# Patient Record
Sex: Female | Born: 1980 | Race: White | Hispanic: No | Marital: Single | Smoking: Current every day smoker
Health system: Southern US, Community
[De-identification: ages and names within clinical notes are randomized; demographics above are authoritative.]

## PROBLEM LIST (undated history)

## (undated) DIAGNOSIS — F1910 Other psychoactive substance abuse, uncomplicated: Secondary | ICD-10-CM

## (undated) HISTORY — PX: ELBOW FRACTURE SURGERY: SHX616

## (undated) HISTORY — PX: HERNIA REPAIR: SHX51

---
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
## 2008-03-10 ENCOUNTER — Emergency Department (HOSPITAL_COMMUNITY): Admission: EM | Admit: 2008-03-10 | Discharge: 2008-03-10 | Payer: Self-pay | Admitting: Family Medicine

## 2009-05-31 ENCOUNTER — Emergency Department (HOSPITAL_COMMUNITY): Admission: EM | Admit: 2009-05-31 | Discharge: 2009-05-31 | Payer: Self-pay | Admitting: Emergency Medicine

## 2009-06-01 ENCOUNTER — Emergency Department (HOSPITAL_COMMUNITY): Admission: EM | Admit: 2009-06-01 | Discharge: 2009-06-01 | Payer: Self-pay | Admitting: Emergency Medicine

## 2010-04-11 ENCOUNTER — Emergency Department (HOSPITAL_COMMUNITY): Admission: EM | Admit: 2010-04-11 | Discharge: 2010-04-11 | Payer: Self-pay | Admitting: Family Medicine

## 2010-04-25 ENCOUNTER — Inpatient Hospital Stay (HOSPITAL_COMMUNITY): Admission: AD | Admit: 2010-04-25 | Discharge: 2010-04-25 | Payer: Self-pay | Admitting: Obstetrics and Gynecology

## 2010-04-25 ENCOUNTER — Ambulatory Visit: Payer: Self-pay | Admitting: Family

## 2010-05-04 ENCOUNTER — Encounter (INDEPENDENT_AMBULATORY_CARE_PROVIDER_SITE_OTHER): Payer: Self-pay | Admitting: Family Medicine

## 2010-05-04 ENCOUNTER — Ambulatory Visit: Payer: Self-pay | Admitting: Obstetrics and Gynecology

## 2010-05-04 LAB — CONVERTED CEMR LAB
Pap Smear: UNDETERMINED
Trich, Wet Prep: NONE SEEN
Yeast Wet Prep HPF POC: NONE SEEN

## 2010-05-09 ENCOUNTER — Ambulatory Visit (HOSPITAL_COMMUNITY): Admission: RE | Admit: 2010-05-09 | Discharge: 2010-05-09 | Payer: Self-pay | Admitting: Family Medicine

## 2010-06-15 ENCOUNTER — Ambulatory Visit: Payer: Self-pay | Admitting: Obstetrics and Gynecology

## 2010-06-29 ENCOUNTER — Ambulatory Visit: Payer: Self-pay | Admitting: Obstetrics & Gynecology

## 2010-07-13 ENCOUNTER — Ambulatory Visit: Payer: Self-pay | Admitting: Obstetrics and Gynecology

## 2010-07-14 ENCOUNTER — Ambulatory Visit (HOSPITAL_COMMUNITY): Admission: RE | Admit: 2010-07-14 | Discharge: 2010-07-14 | Payer: Self-pay | Admitting: Family Medicine

## 2010-08-10 ENCOUNTER — Ambulatory Visit: Payer: Self-pay | Admitting: Obstetrics and Gynecology

## 2010-08-10 LAB — CONVERTED CEMR LAB
Antibody Screen: POSITIVE — AB
Basophils Absolute: 0 10*3/uL (ref 0.0–0.1)
Basophils Relative: 0 % (ref 0–1)
Eosinophils Absolute: 0.1 10*3/uL (ref 0.0–0.7)
Eosinophils Relative: 0 % (ref 0–5)
HCT: 31.8 % — ABNORMAL LOW (ref 36.0–46.0)
HIV: NONREACTIVE
Hemoglobin: 10.7 g/dL — ABNORMAL LOW (ref 12.0–15.0)
Hepatitis B Surface Ag: NEGATIVE
Lymphocytes Relative: 15 % (ref 12–46)
Lymphs Abs: 1.9 10*3/uL (ref 0.7–4.0)
MCHC: 33.6 g/dL (ref 30.0–36.0)
MCV: 91.4 fL (ref 78.0–100.0)
Monocytes Absolute: 0.8 10*3/uL (ref 0.1–1.0)
Monocytes Relative: 6 % (ref 3–12)
Neutro Abs: 10 10*3/uL — ABNORMAL HIGH (ref 1.7–7.7)
Neutrophils Relative %: 78 % — ABNORMAL HIGH (ref 43–77)
Platelets: 399 10*3/uL (ref 150–400)
RBC: 3.48 M/uL — ABNORMAL LOW (ref 3.87–5.11)
RDW: 13.9 % (ref 11.5–15.5)
Rh Type: NEGATIVE
Rubella: 21.2 intl units/mL — ABNORMAL HIGH
WBC: 12.8 10*3/uL — ABNORMAL HIGH (ref 4.0–10.5)

## 2010-09-01 ENCOUNTER — Ambulatory Visit: Payer: Self-pay | Admitting: Obstetrics and Gynecology

## 2010-09-04 ENCOUNTER — Inpatient Hospital Stay (HOSPITAL_COMMUNITY)
Admission: AD | Admit: 2010-09-04 | Discharge: 2010-09-04 | Payer: Self-pay | Source: Home / Self Care | Admitting: Obstetrics & Gynecology

## 2010-09-15 ENCOUNTER — Ambulatory Visit: Payer: Self-pay | Admitting: Family Medicine

## 2010-09-15 ENCOUNTER — Encounter: Payer: Self-pay | Admitting: Physician Assistant

## 2010-09-15 LAB — CONVERTED CEMR LAB
Antibody Screen: POSITIVE — AB
Rh Type: NEGATIVE

## 2010-09-29 ENCOUNTER — Ambulatory Visit: Payer: Self-pay | Admitting: Obstetrics & Gynecology

## 2010-09-29 ENCOUNTER — Encounter (INDEPENDENT_AMBULATORY_CARE_PROVIDER_SITE_OTHER): Payer: Self-pay | Admitting: *Deleted

## 2010-09-30 ENCOUNTER — Ambulatory Visit (HOSPITAL_COMMUNITY)
Admission: RE | Admit: 2010-09-30 | Discharge: 2010-09-30 | Payer: Self-pay | Source: Home / Self Care | Attending: Obstetrics and Gynecology | Admitting: Obstetrics and Gynecology

## 2010-10-13 ENCOUNTER — Ambulatory Visit: Payer: Self-pay | Admitting: Obstetrics & Gynecology

## 2010-10-13 ENCOUNTER — Encounter: Payer: Self-pay | Admitting: Obstetrics & Gynecology

## 2010-10-13 LAB — CONVERTED CEMR LAB
Amphetamine Screen, Ur: NEGATIVE
Barbiturate Quant, Ur: NEGATIVE
Benzodiazepines.: NEGATIVE
Cocaine Metabolites: NEGATIVE
Creatinine,U: 94.1 mg/dL
Marijuana Metabolite: NEGATIVE
Methadone: NEGATIVE
Opiate Screen, Urine: NEGATIVE
Phencyclidine (PCP): NEGATIVE
Propoxyphene: NEGATIVE
Rh Type: NEGATIVE

## 2010-10-14 ENCOUNTER — Encounter: Payer: Self-pay | Admitting: Obstetrics & Gynecology

## 2010-10-14 ENCOUNTER — Ambulatory Visit (HOSPITAL_COMMUNITY)
Admission: RE | Admit: 2010-10-14 | Discharge: 2010-10-14 | Payer: Self-pay | Source: Home / Self Care | Attending: Obstetrics and Gynecology | Admitting: Obstetrics and Gynecology

## 2010-10-14 LAB — CONVERTED CEMR LAB
Chlamydia, DNA Probe: NEGATIVE
GC Probe Amp, Genital: NEGATIVE

## 2010-10-17 ENCOUNTER — Encounter: Payer: Self-pay | Admitting: Obstetrics & Gynecology

## 2010-10-17 ENCOUNTER — Ambulatory Visit
Admission: RE | Admit: 2010-10-17 | Discharge: 2010-10-17 | Payer: Self-pay | Source: Home / Self Care | Attending: Obstetrics & Gynecology | Admitting: Obstetrics & Gynecology

## 2010-10-17 LAB — CONVERTED CEMR LAB: Antibody Screen: POSITIVE — AB

## 2010-10-20 ENCOUNTER — Ambulatory Visit: Admit: 2010-10-20 | Payer: Self-pay | Admitting: Obstetrics and Gynecology

## 2010-10-20 ENCOUNTER — Inpatient Hospital Stay (HOSPITAL_COMMUNITY)
Admission: RE | Admit: 2010-10-20 | Discharge: 2010-10-23 | Payer: Self-pay | Source: Home / Self Care | Attending: Family Medicine | Admitting: Family Medicine

## 2010-10-20 LAB — CBC
HCT: 33.5 % — ABNORMAL LOW (ref 36.0–46.0)
Hemoglobin: 11.1 g/dL — ABNORMAL LOW (ref 12.0–15.0)
MCH: 29.3 pg (ref 26.0–34.0)
MCHC: 33.1 g/dL (ref 30.0–36.0)
MCV: 88.4 fL (ref 78.0–100.0)
Platelets: 307 10*3/uL (ref 150–400)
RBC: 3.79 MIL/uL — ABNORMAL LOW (ref 3.87–5.11)
RDW: 14.2 % (ref 11.5–15.5)
WBC: 11.7 10*3/uL — ABNORMAL HIGH (ref 4.0–10.5)

## 2010-10-20 LAB — MRSA PCR SCREENING: MRSA by PCR: NEGATIVE

## 2010-10-20 LAB — RAPID URINE DRUG SCREEN, HOSP PERFORMED
Amphetamines: NOT DETECTED
Barbiturates: NOT DETECTED
Benzodiazepines: NOT DETECTED
Cocaine: NOT DETECTED
Opiates: NOT DETECTED
Tetrahydrocannabinol: NOT DETECTED

## 2010-10-20 LAB — RPR: RPR Ser Ql: NONREACTIVE

## 2010-10-21 ENCOUNTER — Encounter: Payer: Self-pay | Admitting: Family Medicine

## 2010-10-31 LAB — CBC
HCT: 25.3 % — ABNORMAL LOW (ref 36.0–46.0)
Hemoglobin: 8.2 g/dL — ABNORMAL LOW (ref 12.0–15.0)
MCH: 29.2 pg (ref 26.0–34.0)
MCHC: 32.4 g/dL (ref 30.0–36.0)
MCV: 90 fL (ref 78.0–100.0)
Platelets: 237 10*3/uL (ref 150–400)
RBC: 2.81 MIL/uL — ABNORMAL LOW (ref 3.87–5.11)
RDW: 14.3 % (ref 11.5–15.5)
WBC: 15.7 10*3/uL — ABNORMAL HIGH (ref 4.0–10.5)

## 2010-11-23 ENCOUNTER — Ambulatory Visit (INDEPENDENT_AMBULATORY_CARE_PROVIDER_SITE_OTHER): Payer: Self-pay | Admitting: Family Medicine

## 2010-11-23 DIAGNOSIS — A630 Anogenital (venereal) warts: Secondary | ICD-10-CM

## 2010-11-23 DIAGNOSIS — R87611 Atypical squamous cells cannot exclude high grade squamous intraepithelial lesion on cytologic smear of cervix (ASC-H): Secondary | ICD-10-CM

## 2010-11-25 ENCOUNTER — Encounter: Payer: Self-pay | Admitting: Obstetrics & Gynecology

## 2010-11-25 ENCOUNTER — Other Ambulatory Visit: Payer: Self-pay | Admitting: Obstetrics & Gynecology

## 2010-11-25 ENCOUNTER — Other Ambulatory Visit (HOSPITAL_COMMUNITY)
Admission: RE | Admit: 2010-11-25 | Discharge: 2010-11-25 | Disposition: A | Discharge status: Home / Self Care | Payer: Medicaid Other | Source: Ambulatory Visit | Attending: Family Medicine | Admitting: Family Medicine

## 2010-11-25 DIAGNOSIS — R87611 Atypical squamous cells cannot exclude high grade squamous intraepithelial lesion on cytologic smear of cervix (ASC-H): Secondary | ICD-10-CM

## 2010-11-25 DIAGNOSIS — R87619 Unspecified abnormal cytological findings in specimens from cervix uteri: Secondary | ICD-10-CM | POA: Insufficient documentation

## 2010-12-14 ENCOUNTER — Ambulatory Visit: Payer: Self-pay | Admitting: Family Medicine

## 2010-12-26 LAB — POCT URINALYSIS DIPSTICK
Bilirubin Urine: NEGATIVE
Bilirubin Urine: NEGATIVE
Glucose, UA: NEGATIVE mg/dL
Glucose, UA: NEGATIVE mg/dL
Hgb urine dipstick: NEGATIVE
Ketones, ur: NEGATIVE mg/dL
Ketones, ur: NEGATIVE mg/dL
Nitrite: NEGATIVE
Nitrite: NEGATIVE
Protein, ur: NEGATIVE mg/dL
Protein, ur: NEGATIVE mg/dL
Specific Gravity, Urine: 1.015 (ref 1.005–1.030)
Specific Gravity, Urine: 1.02 (ref 1.005–1.030)
Urobilinogen, UA: 0.2 mg/dL (ref 0.0–1.0)
Urobilinogen, UA: 0.2 mg/dL (ref 0.0–1.0)
pH: 7 (ref 5.0–8.0)
pH: 8 (ref 5.0–8.0)

## 2010-12-27 LAB — POCT URINALYSIS DIPSTICK
Bilirubin Urine: NEGATIVE
Glucose, UA: NEGATIVE mg/dL
Hgb urine dipstick: NEGATIVE
Ketones, ur: NEGATIVE mg/dL
Nitrite: NEGATIVE
Protein, ur: NEGATIVE mg/dL
Specific Gravity, Urine: 1.02 (ref 1.005–1.030)
Urobilinogen, UA: 0.2 mg/dL (ref 0.0–1.0)
pH: 7 (ref 5.0–8.0)

## 2010-12-27 LAB — URINALYSIS, ROUTINE W REFLEX MICROSCOPIC
Bilirubin Urine: NEGATIVE
Glucose, UA: NEGATIVE mg/dL
Hgb urine dipstick: NEGATIVE
Ketones, ur: NEGATIVE mg/dL
Nitrite: NEGATIVE
Protein, ur: NEGATIVE mg/dL
Specific Gravity, Urine: 1.01 (ref 1.005–1.030)
Urobilinogen, UA: 0.2 mg/dL (ref 0.0–1.0)
pH: 6.5 (ref 5.0–8.0)

## 2010-12-28 LAB — POCT URINALYSIS DIPSTICK
Bilirubin Urine: NEGATIVE
Glucose, UA: NEGATIVE mg/dL
Hgb urine dipstick: NEGATIVE
Ketones, ur: NEGATIVE mg/dL
Nitrite: NEGATIVE
Protein, ur: NEGATIVE mg/dL
Specific Gravity, Urine: 1.015 (ref 1.005–1.030)
Urobilinogen, UA: 0.2 mg/dL (ref 0.0–1.0)
pH: 7 (ref 5.0–8.0)

## 2011-01-01 LAB — POCT URINALYSIS DIP (DEVICE)
Bilirubin Urine: NEGATIVE
Glucose, UA: NEGATIVE mg/dL
Hgb urine dipstick: NEGATIVE
Ketones, ur: 15 mg/dL — AB
Nitrite: POSITIVE — AB
Protein, ur: NEGATIVE mg/dL
Specific Gravity, Urine: 1.02 (ref 1.005–1.030)
Urobilinogen, UA: 1 mg/dL (ref 0.0–1.0)
pH: 6.5 (ref 5.0–8.0)

## 2011-01-01 LAB — URINE MICROSCOPIC-ADD ON

## 2011-01-01 LAB — URINALYSIS, ROUTINE W REFLEX MICROSCOPIC
Bilirubin Urine: NEGATIVE
Glucose, UA: NEGATIVE mg/dL
Hgb urine dipstick: NEGATIVE
Ketones, ur: NEGATIVE mg/dL
Nitrite: NEGATIVE
Protein, ur: NEGATIVE mg/dL
Specific Gravity, Urine: 1.015 (ref 1.005–1.030)
Urobilinogen, UA: 0.2 mg/dL (ref 0.0–1.0)
pH: >9 — ABNORMAL HIGH (ref 5.0–8.0)

## 2011-01-21 LAB — CULTURE, ROUTINE-ABSCESS

## 2011-03-03 NOTE — Op Note (Signed)
[place]  Patient:    Carolyn Frey, Carolyn Frey                      MRN: [id number] Proc. Date: 10/19/00 Adm. Date:  [id number] Disc. Date: [id number] Attending:  Burnard Bunting                           Operative Report  PREOPERATIVE DIAGNOSIS:  Left elbow olecranon fracture.  POSTOPERATIVE DIAGNOSIS:  Left elbow olecranon fracture.  OPERATION PERFORMED:  Left elbow open reduction internal fixation of fracture.  SURGEON:  Cammy Copa, M.D.  ANESTHESIA:  General endotracheal.  ESTIMATED BLOOD LOSS:  Minimal.  DRAINS:  None.  TOURNIQUET TIME: One hour and 20 minutes at 250 mmHg.  INDICATIONS FOR PROCEDURE:  The patient is a 30 year old patient who fractured her left elbow.  She presents now for operative management.  DESCRIPTION OF PROCEDURE:  The patient was brought to the operating room where she was placed in the lateral position.  Preoperative antibiotics were given. The left arm was prepped with Betadine and DuraPrep solution and draped in a sterile manner.  It was covered with Betadine impregnated ioban sheet.  The arm was elevated and exsanguinated with an Esmarch wrap and the tourniquet was inflated.  A curvilinear incision was made beginning at about 2 cm proximal to the olecranon process curving aroung the radial aspect of the olecranon process and extending distally about 7 cm.  Skin and subcutaneous tissues were sharply divided.  Full thickness skin flaps were developed medially and laterally.  The fracture was a long oblique fracture and it was visible as the tip was coming through the fascia of the elbow musculature.  The periosteum on either side of the fracture was elevated to allow visualization of the fracture site.  The ulnar nerve was palpated and its location in the ulnar groove was known.  Using a reduction forceps the fracture was reduced.  With the tenaculums in place, reduction was checked in the AP and lateral  planes under fluoroscopy.  The oblique fracture was well reduced.  The crest of the ulna was palpated along the extent of the incision.  The drill for the 6.5 screw was then utilized.  The triceps was split and using the tissue protector, the drill guide was placed through the olecranon process and into the ulnar canal.  A 6.5 screw with washer was then placed across the fractuer site.  The screw was not fully seated.  Using a small drill bit, a hole was created distal to the fracture site for tension band wiring.  This hole was approximately slightly greater than one times the length of the olecranon fracture fragment.  The 18 gauge wire was passed through the ulna and then back through the back underneath the triceps fascia using technique from medial to lateral with a 14 gauge Angiocath.  The screw was then fully seated and then the tension band wire was tightened with the jet tightener. Reduction looked good in the AP and lateral planes.  The cut end of the tension band wire was tucked beneath the fascia.  The incision was thoroughly irrigated and the periosteum overlying the fracture site was closed.  The triceps split was closed using 0 Vicryl suture.  The skin was closed using interrupted inverted 2-0 Vicryl sutures followed by running 3-0 Prolene on the skin.  The patient tolerated the procedure well and was  placed in an arm splint at 90 degrees. She was noted to have good hand perfusion and function in the recovery room. She is to follow up in a week. DD:  12/06/00 TD:  12/06/00 Job: [id number]

## 2011-06-03 ENCOUNTER — Emergency Department (HOSPITAL_COMMUNITY)
Admission: EM | Admit: 2011-06-03 | Discharge: 2011-06-03 | Disposition: A | Discharge status: Home / Self Care | Payer: Self-pay | Attending: Emergency Medicine | Admitting: Emergency Medicine

## 2011-06-03 DIAGNOSIS — R404 Transient alteration of awareness: Secondary | ICD-10-CM | POA: Insufficient documentation

## 2011-06-03 DIAGNOSIS — T50901A Poisoning by unspecified drugs, medicaments and biological substances, accidental (unintentional), initial encounter: Secondary | ICD-10-CM | POA: Insufficient documentation

## 2011-06-03 DIAGNOSIS — T50904A Poisoning by unspecified drugs, medicaments and biological substances, undetermined, initial encounter: Secondary | ICD-10-CM | POA: Insufficient documentation

## 2011-06-03 DIAGNOSIS — F1910 Other psychoactive substance abuse, uncomplicated: Secondary | ICD-10-CM | POA: Insufficient documentation

## 2011-06-03 LAB — POCT I-STAT, CHEM 8
BUN: 14 mg/dL (ref 6–23)
Chloride: 104 mEq/L (ref 96–112)
HCT: 37 % (ref 36.0–46.0)
Sodium: 139 mEq/L (ref 135–145)
TCO2: 24 mmol/L (ref 0–100)

## 2011-06-03 LAB — RAPID URINE DRUG SCREEN, HOSP PERFORMED
Amphetamines: POSITIVE — AB
Opiates: POSITIVE — AB

## 2011-06-03 LAB — ETHANOL: Alcohol, Ethyl (B): <11 mg/dL (ref 0–11)

## 2012-11-24 ENCOUNTER — Encounter (HOSPITAL_COMMUNITY): Payer: Self-pay | Admitting: Nurse Practitioner

## 2012-11-24 ENCOUNTER — Emergency Department (HOSPITAL_COMMUNITY)
Admission: EM | Admit: 2012-11-24 | Discharge: 2012-11-24 | Disposition: A | Discharge status: Home / Self Care | Payer: Self-pay | Attending: Emergency Medicine | Admitting: Emergency Medicine

## 2012-11-24 DIAGNOSIS — L03317 Cellulitis of buttock: Secondary | ICD-10-CM | POA: Insufficient documentation

## 2012-11-24 DIAGNOSIS — L0231 Cutaneous abscess of buttock: Secondary | ICD-10-CM | POA: Insufficient documentation

## 2012-11-24 DIAGNOSIS — F17200 Nicotine dependence, unspecified, uncomplicated: Secondary | ICD-10-CM | POA: Insufficient documentation

## 2012-11-24 DIAGNOSIS — L0291 Cutaneous abscess, unspecified: Secondary | ICD-10-CM

## 2012-11-24 MED ORDER — CEPHALEXIN 500 MG PO CAPS: 500.0000 mg | ORAL_CAPSULE | Freq: Four times a day (QID) | ORAL | Status: DC

## 2012-11-24 MED ORDER — OXYCODONE-ACETAMINOPHEN 5-325 MG PO TABS
2.0000 | ORAL_TABLET | Freq: Once | ORAL | Status: AC
  Administered 2012-11-24: 2 via ORAL
  Filled 2012-11-24: qty 2

## 2012-11-24 MED ORDER — SULFAMETHOXAZOLE-TRIMETHOPRIM 800-160 MG PO TABS: 1.0000 | ORAL_TABLET | Freq: Two times a day (BID) | ORAL | Status: AC

## 2012-11-24 MED ORDER — OXYCODONE-ACETAMINOPHEN 5-325 MG PO TABS: 2.0000 | ORAL_TABLET | ORAL | Status: DC | PRN

## 2012-11-24 NOTE — ED Provider Notes (Signed)
Medical screening examination/treatment/procedure(s) were conducted as a shared visit with non-physician practitioner(s) and myself.  I personally evaluated the patient during the encounter  Left buttock abscess.  No systemic symptoms.  Glynn Octave, MD 11/24/12 1630

## 2012-11-24 NOTE — ED Provider Notes (Signed)
History     CSN: [id number]  Arrival date & time 11/24/12  4098   First MD Initiated Contact with Patient 11/24/12 1041      Chief Complaint  Patient presents with  . Skin Problem    (Consider location/radiation/quality/duration/timing/severity/associated sxs/prior treatment) HPI Comments: Patient presents for "knot" on L buttocks that started 2 wks ago that has been gradually worsening over the past week. The patient states that the area is no longer hard like it started 2 wks ago and has gotten larger, red and painful with superficial scaling. Pain made worse when sitting on area or touching it. Pain improves when no pressure applied. Patient denies fever, chills, N/V/D, urinary symptoms, pain when passing stool, CP, and SOB.  The history is provided by the patient. No language interpreter was used.    No past medical history on file.  No past surgical history on file.  No family history on file.  History  Substance Use Topics  . Smoking status: Current Every Day Smoker    Types: Cigarettes  . Smokeless tobacco: Not on file  . Alcohol Use: No    OB History   Grav Para Term Preterm Abortions TAB SAB Ect Mult Living                  Review of Systems  Constitutional: Negative for fever and chills.  Respiratory: Negative for shortness of breath.   Cardiovascular: Negative for chest pain.  Gastrointestinal: Negative for nausea, vomiting and diarrhea.  Genitourinary: Negative.   Skin: Positive for color change. Negative for pallor and rash.  Neurological: Negative for weakness and numbness.    Allergies  Review of patient's allergies indicates no known allergies.  Home Medications   Current Outpatient Rx  Name  Route  Sig  Dispense  Refill  . cephALEXin (KEFLEX) 500 MG capsule   Oral   Take 1 capsule (500 mg total) by mouth 4 (four) times daily.   28 capsule   0   . oxyCODONE-acetaminophen (PERCOCET/ROXICET) 5-325 MG per tablet   Oral   Take 2 tablets by  mouth every 4 (four) hours as needed for pain.   12 tablet   0   . sulfamethoxazole-trimethoprim (BACTRIM DS,SEPTRA DS) 800-160 MG per tablet   Oral   Take 1 tablet by mouth 2 (two) times daily.   14 tablet   0     BP 126/68  Pulse 70  Temp(Src) 98.1 F (36.7 C) (Oral)  Resp 20  SpO2 98%  Physical Exam  Nursing note and vitals reviewed. Constitutional: She is oriented to person, place, and time. She appears well-developed and well-nourished. No distress.  HENT:  Head: Normocephalic and atraumatic.  Eyes: Conjunctivae are normal. No scleral icterus.  Neck: Normal range of motion.  Pulmonary/Chest: Effort normal.  Abdominal: She exhibits no distension.  Musculoskeletal: Normal range of motion. She exhibits no edema.  Neurological: She is alert and oriented to person, place, and time.  Skin: Skin is warm. No bruising, no ecchymosis, no petechiae and no rash noted. She is not diaphoretic. There is erythema.     Psychiatric: She has a normal mood and affect. Her behavior is normal.    ED Course  Procedures (including critical care time)  Labs Reviewed - No data to display No results found.  INCISION AND DRAINAGE Performed by: Antony Madura Consent: Verbal consent obtained. Risks and benefits: risks, benefits and alternatives were discussed Type: abscess  Body area: left lateral buttocks  Anesthesia:  local infiltration  Incision was made with a scalpel.  Local anesthetic: lidocaine 2% without epinephrine  Anesthetic total: 8 ml  Complexity: complex Blunt dissection to break up loculations  Drainage: purulent  Drainage amount: approx 5-10cc cloudy fluid  Packing material: 1/4 in iodoform gauze  Patient tolerance: Patient tolerated the procedure well with no immediate complications.    1. Abscess      MDM  Patient presents for a "knot" on her left buttocks x 2 wks that has been gradually worsening; denies fever and area is erythematous and fluctuant  with superficial scaling. Have seen and discussed the patient with Dr. Manus Gunning; patient appears to have abscess on L buttocks which requires I&D in ED today.  Patient's abscess incised and drained, packed with 1/4 iodoform gauze, and covered with 4x4 gauze. Tolerated well, is nontoxic and stable for discharge. Patient given script for 7 day course of Bactrim DS and keflex and instructed to return to ED in 2 days for re-check and packing removal. Patient told to change gauze dressing twice a day, every day, until re-check. If packing falls out prior to recheck, patient instructed no to re-pack. Patient told to return to ED sooner if signs of infection develop. Recommend sitz baths for management as well. Percocet prescribed to take as needed for pain. Patient told she may take ibuprofen 400mg  every 6 hours as needed for pain instead of percocet if desired.  Filed Vitals:   11/24/12 0954  BP: 126/68  Pulse: 70  Temp: 98.1 F (36.7 C)  TempSrc: Oral  Resp: 20  SpO2: 98%            Antony Madura, PA-C 11/24/12 1553

## 2012-11-24 NOTE — ED Notes (Signed)
Pt reports a knot to L buttock last week that has now become large, red, painful with scab. A&Ox4, resp e/u

## 2012-11-27 ENCOUNTER — Emergency Department (HOSPITAL_COMMUNITY)
Admission: EM | Admit: 2012-11-27 | Discharge: 2012-11-27 | Disposition: A | Discharge status: Home / Self Care | Payer: Self-pay | Attending: Emergency Medicine | Admitting: Emergency Medicine

## 2012-11-27 ENCOUNTER — Encounter (HOSPITAL_COMMUNITY): Payer: Self-pay | Admitting: *Deleted

## 2012-11-27 DIAGNOSIS — Z4801 Encounter for change or removal of surgical wound dressing: Secondary | ICD-10-CM | POA: Insufficient documentation

## 2012-11-27 DIAGNOSIS — Z5189 Encounter for other specified aftercare: Secondary | ICD-10-CM

## 2012-11-27 DIAGNOSIS — F17200 Nicotine dependence, unspecified, uncomplicated: Secondary | ICD-10-CM | POA: Insufficient documentation

## 2012-11-27 NOTE — ED Notes (Signed)
Reports needing wound packing removed from buttock. No other complaints.

## 2012-11-27 NOTE — ED Provider Notes (Signed)
History     CSN: [id number]  Arrival date & time 11/27/12  1033   First MD Initiated Contact with Patient 11/27/12 1119      Chief Complaint  Patient presents with  . Wound Check    (Consider location/radiation/quality/duration/timing/severity/associated sxs/prior treatment) HPI Comments: 32 year old female presents emergency department for packing removal from an abscess on her left buttock region that was placed 3 days ago. She is currently on Keflex and Bactrim for sending cellulitis and she is continuing to take these as prescribed. Denies any worsening symptoms. No fever or chills. Denies increased pain or swelling around the area.  Patient is a 32 y.o. female presenting with wound check. The history is provided by the patient.  Wound Check Pertinent negatives include no chills or fever.    History reviewed. No pertinent past medical history.  History reviewed. No pertinent past surgical history.  History reviewed. No pertinent family history.  History  Substance Use Topics  . Smoking status: Current Every Day Smoker    Types: Cigarettes  . Smokeless tobacco: Not on file  . Alcohol Use: No    OB History   Grav Para Term Preterm Abortions TAB SAB Ect Mult Living                  Review of Systems  Constitutional: Negative for fever and chills.  Skin: Positive for wound.  All other systems reviewed and are negative.    Allergies  Review of patient's allergies indicates no known allergies.  Home Medications   Current Outpatient Rx  Name  Route  Sig  Dispense  Refill  . cephALEXin (KEFLEX) 500 MG capsule   Oral   Take 1 capsule (500 mg total) by mouth 4 (four) times daily.   28 capsule   0   . oxyCODONE-acetaminophen (PERCOCET/ROXICET) 5-325 MG per tablet   Oral   Take 2 tablets by mouth every 4 (four) hours as needed for pain.   12 tablet   0   . sulfamethoxazole-trimethoprim (BACTRIM DS,SEPTRA DS) 800-160 MG per tablet   Oral   Take 1 tablet by  mouth 2 (two) times daily.   14 tablet   0     BP 121/77  Pulse 86  Temp(Src) 98.1 F (36.7 C) (Oral)  Resp 18  SpO2 100%  Physical Exam  Nursing note and vitals reviewed. Constitutional: She is oriented to person, place, and time. She appears well-developed and well-nourished. No distress.  HENT:  Head: Normocephalic and atraumatic.  Mouth/Throat: Oropharynx is clear and moist.  Eyes: Conjunctivae are normal.  Neck: Normal range of motion. Neck supple.  Cardiovascular: Normal rate, regular rhythm and normal heart sounds.   Pulmonary/Chest: Effort normal and breath sounds normal.  Abdominal: There is no tenderness.  Musculoskeletal: Normal range of motion. She exhibits no edema.  Neurological: She is alert and oriented to person, place, and time.  Skin: Skin is warm.  2 cm diameter open wound on left buttock with packing in place. No surrounding erythema or edema. Very mild tenderness to palpation. No drainage. No warmth to palpation.  Psychiatric: She has a normal mood and affect. Her behavior is normal.    ED Course  Procedures (including critical care time)  Labs Reviewed - No data to display No results found.   1. Wound check, abscess       MDM  32 year old female presenting for packing removal from an abscess. She's currently on Keflex and Bactrim. Advised her to continue taking  his antibiotics until completion. Packing removed. No drainage from wound. No evidence of new surrounding cellulitis. She is in no apparent distress. Stable for discharge. Advised her to continue to apply warm compresses. Return precautions discussed. Patient states understanding of plan and is agreeable.       Trevor Mace, PA-C 11/27/12 1148

## 2012-11-28 NOTE — ED Provider Notes (Signed)
Medical screening examination/treatment/procedure(s) were performed by non-physician practitioner and as supervising physician I was immediately available for consultation/collaboration.   Joya Gaskins, MD 11/28/12 [phone]

## 2015-06-19 ENCOUNTER — Emergency Department (HOSPITAL_COMMUNITY)
Admission: EM | Admit: 2015-06-19 | Discharge: 2015-06-19 | Disposition: A | Discharge status: Home / Self Care | Payer: Self-pay | Attending: Emergency Medicine | Admitting: Emergency Medicine

## 2015-06-19 ENCOUNTER — Encounter (HOSPITAL_COMMUNITY): Payer: Self-pay | Admitting: Emergency Medicine

## 2015-06-19 DIAGNOSIS — Z3202 Encounter for pregnancy test, result negative: Secondary | ICD-10-CM | POA: Insufficient documentation

## 2015-06-19 DIAGNOSIS — F1410 Cocaine abuse, uncomplicated: Secondary | ICD-10-CM | POA: Insufficient documentation

## 2015-06-19 DIAGNOSIS — F1310 Sedative, hypnotic or anxiolytic abuse, uncomplicated: Secondary | ICD-10-CM | POA: Insufficient documentation

## 2015-06-19 DIAGNOSIS — Z720 Tobacco use: Secondary | ICD-10-CM | POA: Insufficient documentation

## 2015-06-19 DIAGNOSIS — Z79899 Other long term (current) drug therapy: Secondary | ICD-10-CM | POA: Insufficient documentation

## 2015-06-19 DIAGNOSIS — F1910 Other psychoactive substance abuse, uncomplicated: Secondary | ICD-10-CM

## 2015-06-19 DIAGNOSIS — F1994 Other psychoactive substance use, unspecified with psychoactive substance-induced mood disorder: Secondary | ICD-10-CM | POA: Diagnosis present

## 2015-06-19 DIAGNOSIS — F1510 Other stimulant abuse, uncomplicated: Secondary | ICD-10-CM | POA: Insufficient documentation

## 2015-06-19 HISTORY — DX: Other psychoactive substance abuse, uncomplicated: F19.10

## 2015-06-19 LAB — CBC
HEMATOCRIT: 34 % — AB (ref 36.0–46.0)
Hemoglobin: 10.8 g/dL — ABNORMAL LOW (ref 12.0–15.0)
MCH: 28 pg (ref 26.0–34.0)
MCHC: 31.8 g/dL (ref 30.0–36.0)
MCV: 88.1 fL (ref 78.0–100.0)
Platelets: 391 10*3/uL (ref 150–400)
RBC: 3.86 MIL/uL — ABNORMAL LOW (ref 3.87–5.11)
RDW: 13 % (ref 11.5–15.5)
WBC: 6.8 10*3/uL (ref 4.0–10.5)

## 2015-06-19 LAB — COMPREHENSIVE METABOLIC PANEL
ALBUMIN: 4.3 g/dL (ref 3.5–5.0)
ALK PHOS: 58 U/L (ref 38–126)
ALT: 13 U/L — AB (ref 14–54)
AST: 22 U/L (ref 15–41)
Anion gap: 8 (ref 5–15)
BILIRUBIN TOTAL: 0.3 mg/dL (ref 0.3–1.2)
BUN: 9 mg/dL (ref 6–20)
CALCIUM: 9.2 mg/dL (ref 8.9–10.3)
CO2: 27 mmol/L (ref 22–32)
Chloride: 103 mmol/L (ref 101–111)
Creatinine, Ser: 0.79 mg/dL (ref 0.44–1.00)
GFR calc Af Amer: >60 mL/min (ref >60)
GFR calc non Af Amer: >60 mL/min (ref >60)
GLUCOSE: 80 mg/dL (ref 65–99)
Potassium: 3.5 mmol/L (ref 3.5–5.1)
Sodium: 138 mmol/L (ref 135–145)
TOTAL PROTEIN: 7.6 g/dL (ref 6.5–8.1)

## 2015-06-19 LAB — I-STAT BETA HCG BLOOD, ED (MC, WL, AP ONLY)

## 2015-06-19 LAB — RAPID URINE DRUG SCREEN, HOSP PERFORMED
Amphetamines: POSITIVE — AB
BARBITURATES: NOT DETECTED
BENZODIAZEPINES: POSITIVE — AB
Cocaine: POSITIVE — AB
Opiates: NOT DETECTED
TETRAHYDROCANNABINOL: NOT DETECTED

## 2015-06-19 LAB — ETHANOL: Alcohol, Ethyl (B): <5 mg/dL (ref <5)

## 2015-06-19 LAB — SALICYLATE LEVEL: Salicylate Lvl: <4 mg/dL (ref 2.8–30.0)

## 2015-06-19 LAB — ACETAMINOPHEN LEVEL: Acetaminophen (Tylenol), Serum: <10 ug/mL — ABNORMAL LOW (ref 10–30)

## 2015-06-19 MED ORDER — LORAZEPAM 1 MG PO TABS
1.0000 mg | ORAL_TABLET | Freq: Once | ORAL | Status: AC
  Administered 2015-06-19: 1 mg via ORAL
  Filled 2015-06-19: qty 1

## 2015-06-19 NOTE — ED Notes (Signed)
[name] into see 

## 2015-06-19 NOTE — ED Notes (Signed)
Up on the phone 
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
## 2015-06-19 NOTE — ED Provider Notes (Signed)
CSN: [id number]     Arrival date & time 06/19/15  1044 History   First MD Initiated Contact with Patient 06/19/15 1121     Chief Complaint  Patient presents with  . IVC      (Consider location/radiation/quality/duration/timing/severity/associated sxs/prior Treatment) HPI 34 y.o. female presents with reported suicidal ideation and IV cocaine use.  Patient arrives in custody of police with IVC paperwork filled out by family, the patient's sister states that the patient has made statements about wanting to harm herself.  On arrival here the patient is without medical complaint, denies suicidal or homicidal ideations. She also denies any illicit drug use.   Past Medical History  Diagnosis Date  . Substance abuse    History reviewed. No pertinent past surgical history. No family history on file. Social History  Substance Use Topics  . Smoking status: Current Every Day Smoker    Types: Cigarettes  . Smokeless tobacco: None  . Alcohol Use: No   OB History    No data available     Review of Systems  All other systems reviewed and are negative.     Allergies  Review of patient's allergies indicates no known allergies.  Home Medications   Prior to Admission medications   Medication Sig Start Date End Date Taking? Authorizing Provider  methadone (DOLOPHINE) 10 MG/ML solution Take 40 mg by mouth daily.   Yes Historical Provider, MD   BP 113/82 mmHg  Pulse 73  Temp(Src) 97.8 F (36.6 C) (Oral)  Resp 15  SpO2 100%  LMP 05/29/2015 Physical Exam  Constitutional: She is oriented to person, place, and time. She appears well-developed and well-nourished.  HENT:  Head: Normocephalic and atraumatic.  Right Ear: External ear normal.  Left Ear: External ear normal.  Eyes: Conjunctivae and EOM are normal. Pupils are equal, round, and reactive to light.  Neck: Normal range of motion. Neck supple.  Cardiovascular: Normal rate, regular rhythm, normal heart sounds and intact distal  pulses.   Pulmonary/Chest: Effort normal and breath sounds normal.  Abdominal: Soft. Bowel sounds are normal. There is no tenderness.  Musculoskeletal: Normal range of motion.  Neurological: She is alert and oriented to person, place, and time.  Skin: Skin is warm and dry.  Vitals reviewed.   ED Course  Procedures (including critical care time) Labs Review Labs Reviewed  COMPREHENSIVE METABOLIC PANEL - Abnormal; Notable for the following:    ALT 13 (*)    All other components within normal limits  ACETAMINOPHEN LEVEL - Abnormal; Notable for the following:    Acetaminophen (Tylenol), Serum <10 (*)    All other components within normal limits  CBC - Abnormal; Notable for the following:    RBC 3.86 (*)    Hemoglobin 10.8 (*)    HCT 34.0 (*)    All other components within normal limits  URINE RAPID DRUG SCREEN, HOSP PERFORMED - Abnormal; Notable for the following:    Cocaine POSITIVE (*)    Benzodiazepines POSITIVE (*)    Amphetamines POSITIVE (*)    All other components within normal limits  ETHANOL  SALICYLATE LEVEL  I-STAT BETA HCG BLOOD, ED ([facility], [facility], [facility] ONLY)    Imaging Review No results found. I have personally reviewed and evaluated these images and lab results as part of my medical decision-making.   EKG Interpretation None      MDM   Final diagnoses:  Polysubstance abuse    34 y.o. female with pertinent PMH of substance abuse presents with  reported SI, however she denies this at this time.  Physical exam with multiple small punctures overlying wrists, potentially from IV drug abuse, but pt denies this.  Consulted psych.    Psych obtained further history: there was no clear SI reported, more passive and suspicious behavior with primary concern of rampant drug use.  DC home in stable condition.  I have reviewed all laboratory and imaging studies if ordered as above  1. Polysubstance abuse         Mirian Mo, MD 06/20/15 0700
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
## 2015-06-19 NOTE — ED Notes (Signed)
IVC has been rescinded by Dr Clayborne Dana, pt up to call for a ride home
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
## 2015-06-19 NOTE — Discharge Instructions (Signed)
Suicidal Feelings, How to Help Yourself °Everyone feels sad or unhappy at times, but depressing thoughts and feelings of hopelessness can lead to thoughts of suicide. It can seem as if life is too tough to handle. If you feel as though you have reached the point where suicide is the only answer, it is time to let someone know immediately.  °HOW TO COPE AND PREVENT SUICIDE °· Let family, friends, teachers, or counselors know. Get help. Try not to isolate yourself from those who care about you. Even though you may not feel sociable, talk with someone every day. It is best if it is face-to-face. Remember, they will want to help you. °· Eat a regularly spaced and well-balanced diet. °· Get plenty of rest. °· Avoid alcohol and drugs because they will only make you feel worse and may also lower your inhibitions. Remove them from the home. If you are thinking of taking an overdose of your prescribed medicines, give your medicines to someone who can give them to you one day at a time. If you are on antidepressants, let your caregiver know of your feelings so he or she can provide a safer medicine, if that is a concern. °· Remove weapons or poisons from your home. °· Try to stick to routines. Follow a schedule and remind yourself that you have to keep that schedule every day. °· Set some realistic goals and achieve them. Make a list and cross things off as you go. Accomplishments give a sense of worth. Wait until you are feeling better before doing things you find difficult or unpleasant to do. °· If you are able, try to start exercising. Even half-hour periods of exercise each day will make you feel better. Getting out in the sun or into nature helps you recover from depression faster. If you have a favorite place to walk, take advantage of that. °· Increase safe activities that have always given you pleasure. This may include playing your favorite music, reading a good book, painting a picture, or playing your favorite  instrument. Do whatever takes your mind off your depression. °· Keep your living space well-lighted. °GET HELP °Contact a suicide hotline, crisis center, or local suicide prevention center for help right away. Local centers may include a hospital, clinic, community service organization, social service provider, or health department. °· Call your local emergency services (911 in the [place]). °· Call a suicide hotline: °¨ 1-800-273-TALK (1-800-273-8255) in the [place]. °¨ 1-800-SUICIDE (1-800-784-2433) in the [place]. °¨ 1-888-628-9454 in the [place] for Spanish-speaking counselors. °¨ 1-800-799-4TTY (1-800-799-4889) in the [place] for TTY users. °· Visit the following websites for information and help: °¨ National Suicide Prevention Lifeline: www.suicidepreventionlifeline.org °¨ Hopeline: www.hopeline.com °¨ American Foundation for Suicide Prevention: www.afsp.org °· For lesbian, gay, bisexual, transgender, or questioning youth, contact The Trevor Project: °¨ 1-866-4-U-TREVOR (1-866-488-7386) in the [place]. °¨ www.thetrevorproject.org °· In [place], treatment resources are listed in each province with listings available under [employer] or similar titles. Another source for Crisis Centres by Province is located at http://www.suicideprevention.[place]/in-crisis-now/find-a-crisis-centre-now/crisis-centres °Document Released: 04/08/2003 Document Revised: 12/25/2011 Document Reviewed: 01/27/2014 °ExitCare® Patient Information ©2015 [employer]. This information is not intended to replace advice given to you by your health care provider. Make sure you discuss any questions you have with your health care provider. ° °

## 2015-06-19 NOTE — ED Notes (Addendum)
Written dc instructions reviewed w/ pt.  Pt encouraged to follow up w/ [facility] this week as scheduled, and return/seek treatment for any suicidal thoughts/urges.  Pt verbalized understanding.  Mobile crisis information card given to pt.  Pt ambulatory w/o difficulty to dc window w/ [facility], belongings returned after leaving the unit.  Pt has been arainge for some on to pick her up.

## 2015-06-19 NOTE — ED Notes (Signed)
Pt ambulatory from triage to room 35 w/o difficulty

## 2015-06-19 NOTE — BH Assessment (Signed)
Counselor contacted the patients mother Ms. Gwilliam and Mr Poinsett at [phone] for collateral information due to the patient denying SI/HI and psychosis after her ex-boyfriend filed a petition.   There was no answer and the patient provided a number for her best friend at [phone] to speak with Valentino Nose regarding the patients safety.  Huntley Dec states that the patient is her friend and she has known her since high school and states that she saw her a week or two ago and she did not say anything about being unsafe.  Huntley Dec states that she has a son who is five and she feels that he is safe and does not feel that she would be threatened at all.  Huntley Dec states that the patient has never threatened to harm herself to her knowledge but if someone said that she "threatened to kill herself I would not have a hard time believing it."  Counselor informed Huntley Dec that she is unable to release any information relating to her care.
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
## 2015-06-19 NOTE — ED Notes (Signed)
Sitting in room crying.

## 2015-09-24 ENCOUNTER — Encounter (HOSPITAL_BASED_OUTPATIENT_CLINIC_OR_DEPARTMENT_OTHER): Payer: Self-pay | Admitting: Emergency Medicine

## 2015-09-24 ENCOUNTER — Emergency Department (HOSPITAL_BASED_OUTPATIENT_CLINIC_OR_DEPARTMENT_OTHER)
Admission: EM | Admit: 2015-09-24 | Discharge: 2015-09-24 | Disposition: A | Discharge status: Home / Self Care | Payer: Medicaid Other | Attending: Emergency Medicine | Admitting: Emergency Medicine

## 2015-09-24 DIAGNOSIS — Y9389 Activity, other specified: Secondary | ICD-10-CM | POA: Insufficient documentation

## 2015-09-24 DIAGNOSIS — Z79891 Long term (current) use of opiate analgesic: Secondary | ICD-10-CM | POA: Insufficient documentation

## 2015-09-24 DIAGNOSIS — F17210 Nicotine dependence, cigarettes, uncomplicated: Secondary | ICD-10-CM | POA: Insufficient documentation

## 2015-09-24 DIAGNOSIS — Y92410 Unspecified street and highway as the place of occurrence of the external cause: Secondary | ICD-10-CM | POA: Insufficient documentation

## 2015-09-24 DIAGNOSIS — Y998 Other external cause status: Secondary | ICD-10-CM | POA: Insufficient documentation

## 2015-09-24 DIAGNOSIS — S29001A Unspecified injury of muscle and tendon of front wall of thorax, initial encounter: Secondary | ICD-10-CM | POA: Insufficient documentation

## 2015-09-24 DIAGNOSIS — R0789 Other chest pain: Secondary | ICD-10-CM

## 2015-09-24 NOTE — Discharge Instructions (Signed)
1. Medications: alternate naprosyn and tylenol for pain control, usual home medications 2. Treatment: rest, ice area of pain, drink plenty of fluids, gentle stretching 3. Follow Up: Please followup with PCP in 1 week if no improvement for discussion of your diagnoses and further evaluation after today's visit; if you do not have a primary care doctor use the resource guide provided to find one; Please return to the ER for worsening symptoms or other concerns   Chest Wall Pain Chest wall pain is pain in or around the bones and muscles of your chest. Sometimes, an injury causes this pain. Sometimes, the cause may not be known. This pain may take several weeks or longer to get better. HOME CARE INSTRUCTIONS  Pay attention to any changes in your symptoms. Take these actions to help with your pain:   Rest as told by your health care provider.   Avoid activities that cause pain. These include any activities that use your chest muscles or your abdominal and side muscles to lift heavy items.   If directed, apply ice to the painful area:  Put ice in a plastic bag.  Place a towel between your skin and the bag.  Leave the ice on for 20 minutes, 2-3 times per day.  Take over-the-counter and prescription medicines only as told by your health care provider.  Do not use tobacco products, including cigarettes, chewing tobacco, and e-cigarettes. If you need help quitting, ask your health care provider.  Keep all follow-up visits as told by your health care provider. This is important. SEEK MEDICAL CARE IF:  You have a fever.  Your chest pain becomes worse.  You have new symptoms. SEEK IMMEDIATE MEDICAL CARE IF:  You have nausea or vomiting.  You feel sweaty or light-headed.  You have a cough with phlegm (sputum) or you cough up blood.  You develop shortness of breath.   This information is not intended to replace advice given to you by your health care provider. Make sure you discuss  any questions you have with your health care provider.   Document Released: 10/02/2005 Document Revised: 06/23/2015 Document Reviewed: 12/28/2014 Elsevier Interactive Patient Education 2016 [employer].    Emergency Department Resource Guide 1) Find a Doctor and Pay Out of Pocket Although you won't have to find out who is covered by your insurance plan, it is a good idea to ask around and get recommendations. You will then need to call the office and see if the doctor you have chosen will accept you as a new patient and what types of options they offer for patients who are self-pay. Some doctors offer discounts or will set up payment plans for their patients who do not have insurance, but you will need to ask so you aren't surprised when you get to your appointment.  2) Contact Your Local Health Department Not all health departments have doctors that can see patients for sick visits, but many do, so it is worth a call to see if yours does. If you don't know where your local health department is, you can check in your phone book. The CDC also has a tool to help you locate your state's health department, and many state websites also have listings of all of their local health departments.  3) Find a Walk-in Clinic If your illness is not likely to be very severe or complicated, you may want to try a walk in clinic. These are popping up all over the country in pharmacies, drugstores,  and shopping centers. They're usually staffed by nurse practitioners or physician assistants that have been trained to treat common illnesses and complaints. They're usually fairly quick and inexpensive. However, if you have serious medical issues or chronic medical problems, these are probably not your best option.  No Primary Care Doctor: - Call Health Connect at  [phone] - they can help you locate a primary care doctor that  accepts your insurance, provides certain services, etc. - Physician Referral Service-  [phone]  Chronic Pain Problems: Organization         Address  Phone   Notes  [facility]  [phone] Patients need to be referred by their primary care doctor.   Medication Assistance: Organization         Address  Phone   Notes  [facility] Medication [facility] [address] [place], [place] [address] [phone] --Must be a resident of [facility] -- Must have NO insurance coverage whatsoever (no Medicaid/ Medicare, etc.) -- The pt. MUST have a primary care doctor that directs their care regularly and follows them in the community   [facility]  [phone]   [employer]  [phone]    Agencies that provide inexpensive medical care: Organization         Address  Phone   Notes  [facility]  [phone]   [facility]    [phone]   [facility] [address] [place], [place] [address] [phone]   [facility] [address] [place] [place] [phone]   Planned Parenthood    [phone]   [facility]    [phone]   [facility]  [address], [place] Phone:  [phone], Fax:  [phone] Hours of Operation:  9 am - 6 pm, M-F.  Also accepts Medicaid/Medicare and self-pay.  [facility]  [address], [place] Phone: [phone], Fax: [phone]. Hours of Operation:  8:30 am - 5:30 pm, M-F.  Also accepts Medicaid and self-pay.  [facility] [place] [address], [place] Phone: [phone]   [facility] [address] Natasha Bence [place], [place] [phone], Ext. [phone] Mondays & Thursdays: 7-9 AM.  First 15 patients are seen on a first come, first serve basis.    Medicaid-accepting [facility] Providers:  Organization         Address  Phone   Notes  [facility] [address],  [place] [phone] Also accepts self-pay patients.  [facility] [address] Laurell Josephs [place], [place]  [phone]   [facility] [address], [place] [phone]   [facility] [address] [place] [phone]   Renaye Rakers [address], [place]   [phone] Only accepts [place] patients after they have their name applied to their card.   Self-Pay (no insurance) in [facility]:  Organization         Address  Phone   Notes  Sickle Cell Patients, [facility] [address] [place], [place] [phone]   [facility] [address] [place], [place] [phone]   [facility] [place]  [address], [place] [phone])  [phone]   [facility]/Dr. Julio Sicks  [address] [place] or [address], [place] [phone] Phone number for both [place] and [place] locations is the same.  [facility] [address], [place] [phone]   [facility] [address] [place] or [address] [phone]   [facility] [address] [place] [phone], phone; [phone], fax Sees patients 1st and 3rd Saturday of every month.  Must not qualify for public or private insurance (i.e. Medicaid, Medicare, [place] Health Choice, Veterans' Benefits)  Household income should be no more than 200% of the poverty level The clinic cannot treat you if you are pregnant or think you are pregnant  Sexually transmitted diseases are not treated at the clinic.    Dental Care: Organization         Address  Phone  Notes  [facility] Department of [facility] [address] [place], [place] [phone] Accepts children up to age 25 who are enrolled in  [place] or [place] Health Choice; pregnant women with a Medicaid card; and children who have applied for Medicaid or [place] Health Choice, but were declined, whose parents can pay a reduced fee at time of service.  [facility] of [facility]  [address], [place] [phone] Accepts children up to age 101 who are enrolled in [place] or [place] Health Choice; pregnant women with a Medicaid card; and children who have applied for Medicaid or [place] Health Choice, but were declined, whose parents can pay a reduced fee at time of service.  [facility]  [address] [place], [place] [phone] Patients are seen by appointment only. Walk-ins are not accepted. [facility] will see patients 68 years of age and older. Monday - Tuesday (8am-5pm) Most Wednesdays (8:30-5pm) $30 per visit, cash only  [facility]  [address], [facility] [phone] Patients are seen by appointment only. Walk-ins are not accepted. [facility] will see patients 79 years of age and older. One Wednesday Evening (Monthly: Volunteer Based).  $30 per visit, cash only  [employer]  [phone] for adults; Children under age 22, call Graduate Pediatric Dentistry at [phone]. Children aged 32-14, please call [phone] to request a pediatric application.  Dental services are provided in all areas of dental care including fillings, crowns and bridges, complete and partial dentures, implants, gum treatment, root canals, and extractions. Preventive care is also provided. Treatment is provided to both adults and children. Patients are selected via a lottery and there is often a waiting list.   [facility] [address] [place]  [phone] [email or web address]   [facility] [address] [place], [place] [phone], Ext. [phone] Second and Fourth Thursday of each month, opens at 6:30  AM; Clinic ends at 9 AM.  Patients are seen on a first-come first-served basis, and a limited number are seen during each clinic.   [facility]  [address] Ether [place], [place] [phone]   Eligibility Requirements You must have lived in [place], [place], or [place] counties for at least the last three months.   You cannot be eligible for state or federal sponsored [place], including [employer], [place], or [employer].   You generally cannot be eligible  for healthcare insurance through your employer.    How to apply: Eligibility screenings are held every Tuesday and Wednesday afternoon from 1:00 pm until 4:00 pm. You do not need an appointment for the interview!  [facility] [address], [place], [place] [phone]   [facility]  [phone]   [facility]  [phone]   [facility]  [phone]    Behavioral Health Resources in the Community: Intensive Outpatient Programs Organization         Address  Phone  Notes  [facility] [address], [place], [place] [phone]   [facility] [address], [place], [place] [phone]   [facility] [address], [place], [place]  [phone]   [facility] [address],  [place], [place] [phone] or [phone]   Substance Abuse Resources Organization         Address  Phone  Notes  [facility]  [phone]   [facility]  [phone]   [facility]  [phone]   [facility]  [phone]   Residential & Outpatient Substance Abuse Program  [phone]   Psychological Services Organization         Address  Phone  Notes  [facility]  [phone]- [phone]   [facility]  [phone]   [facility] [address], [place] [phone] or  [phone]    Mobile Crisis Teams Organization         Address  Phone  Notes  [facility], Mobile Crisis Care Unit  [phone]   [facility]  [address] [place], [place] [phone]   Doristine LocksSharon DeEsch [address] [place] [phone]    Self-Help/Support Groups Organization         Address  Phone             Notes  [facility] [place] - variety of support groups  [phone] Call for more information  Narcotics Anonymous (NA), [facility] [address], [place]  2 meetings at this location   Statisticianesidential Treatment Programs Organization         Address  Phone  Notes  [facility] [address],    [place]  [phone]   [facility]  [address], [place] [phone], [place], [place] [phone]   [facility] [address], [place] [phone] Admissions: 8am-3pm M-F  [facility] [address]    [place], [place] [phone]   [facility] [address], [place], [place] [phone]   [facility] [address]  [place], [place] [phone]   [facility] - Intensive Outpatient [address], [place], [place] [phone]   [facility] [address]  [place], [place] [phone] or [phone]   [facility] ([facility]) [address] [place], [place] [phone] Accepts Medicaid  [facility] [address]  [place] [phone] Substance Abuse/Addiction Treatment   [facility] Resources Organization         Address  Phone  Notes  [facility]  [phone]   Angie FavaJulie Brannon, PhD [address] [place], [place]   [phone] or [phone]   [facility]   [address] [place], [place] (  [phone]   [facility] [address],  [place], [place] [phone] Insurance/Medicaid/sponsorship through [employer] and Families [address]                                    [place], [place] [phone] Therapy/tele-psych/case  [facility] [address]   [place], [place] [phone]    Dr. Lolly Mustache  [phone]   [facility]  [employer] [facility] Dept. 1) [address] [place] 2) [address] [place] 3)  [address], [place] [phone]  [phone]   [facility] Child Abuse Hotline [phone] or [phone] (After Hours)
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
## 2015-09-24 NOTE — ED Notes (Signed)
Gave report to Sam, RN

## 2015-11-06 ENCOUNTER — Encounter (HOSPITAL_COMMUNITY): Payer: Self-pay | Admitting: *Deleted

## 2015-11-06 ENCOUNTER — Emergency Department (HOSPITAL_COMMUNITY)
Admission: EM | Admit: 2015-11-06 | Discharge: 2015-11-06 | Discharge status: Against Medical Advice | Payer: Medicaid Other | Attending: Emergency Medicine | Admitting: Emergency Medicine

## 2015-11-06 DIAGNOSIS — O209 Hemorrhage in early pregnancy, unspecified: Secondary | ICD-10-CM | POA: Insufficient documentation

## 2015-11-06 DIAGNOSIS — O99330 Smoking (tobacco) complicating pregnancy, unspecified trimester: Secondary | ICD-10-CM | POA: Insufficient documentation

## 2015-11-06 DIAGNOSIS — Z3A00 Weeks of gestation of pregnancy not specified: Secondary | ICD-10-CM | POA: Insufficient documentation

## 2015-11-06 DIAGNOSIS — F17210 Nicotine dependence, cigarettes, uncomplicated: Secondary | ICD-10-CM | POA: Insufficient documentation

## 2015-11-06 LAB — I-STAT BETA HCG BLOOD, ED (MC, WL, AP ONLY): I-stat hCG, quantitative: 87.1 m[IU]/mL — ABNORMAL HIGH (ref <5)

## 2015-11-06 NOTE — ED Notes (Signed)
No answer from pt in waiting room 

## 2015-11-06 NOTE — ED Notes (Signed)
Unable to locate pt in waiting room.

## 2015-11-06 NOTE — ED Notes (Signed)
Unable to locate pt in waiting room.  Called multiple times.  

## 2015-11-06 NOTE — ED Notes (Signed)
Pt reports onset this am mild vaginal bleeding, denies pain. Denies passing clots. No acute distress noted at triage.

## 2016-09-11 ENCOUNTER — Encounter (HOSPITAL_COMMUNITY): Payer: Self-pay

## 2016-09-11 ENCOUNTER — Emergency Department (HOSPITAL_COMMUNITY): Payer: Self-pay

## 2016-09-11 ENCOUNTER — Emergency Department (HOSPITAL_COMMUNITY)
Admission: EM | Admit: 2016-09-11 | Discharge: 2016-09-11 | Disposition: A | Discharge status: Home / Self Care | Payer: Self-pay | Attending: Emergency Medicine | Admitting: Emergency Medicine

## 2016-09-11 DIAGNOSIS — F17210 Nicotine dependence, cigarettes, uncomplicated: Secondary | ICD-10-CM | POA: Insufficient documentation

## 2016-09-11 DIAGNOSIS — L03011 Cellulitis of right finger: Secondary | ICD-10-CM | POA: Insufficient documentation

## 2016-09-11 MED ORDER — CLINDAMYCIN HCL 150 MG PO CAPS: 300.0000 mg | ORAL_CAPSULE | Freq: Three times a day (TID) | ORAL | 0 refills | Status: DC

## 2016-09-11 MED ORDER — CLINDAMYCIN HCL 150 MG PO CAPS
300.0000 mg | ORAL_CAPSULE | Freq: Once | ORAL | Status: AC
  Administered 2016-09-11: 300 mg via ORAL
  Filled 2016-09-11: qty 2

## 2016-09-11 MED ORDER — LIDOCAINE-EPINEPHRINE 1 %-1:100000 IJ SOLN
10.0000 mL | Freq: Once | INTRAMUSCULAR | Status: AC
  Administered 2016-09-11: 10 mL
  Filled 2016-09-11: qty 1

## 2016-09-11 NOTE — ED Notes (Signed)
Papers and prescriptions reviewed and patient verbalizes understanding. A little lethargic but was awake for DC instructions

## 2016-09-11 NOTE — Discharge Instructions (Signed)
[facility] TOENAIL  Keep area clean, dry and bandaged for 24 hours.  After 24 hours, remove outer bandage and leave yellow gauze in place.  Soak hand in warm soapy water for 5-10 minutes, 2x daily  for 5 days. Rebandage finger after each cleaning.  Continue soaks until yellow gauze falls off.  return if you experience any of the following signs of infection: Swelling, redness, pus drainage, streaking, fever > 101.0 F  Please take all of your antibiotics until finished!   You may develop abdominal discomfort or diarrhea from the antibiotic.  You may help offset this with probiotics which you can buy or get in yogurt. Do not eat  or take the probiotics until 2 hours after your antibiotic.

## 2016-09-11 NOTE — ED Provider Notes (Addendum)
[facility]PT Provider Note   CSN: [id number] Arrival date & time: 09/11/16  0609     History   Chief Complaint Chief Complaint  Patient presents with  . Finger Injury    HPI Carolyn Frey is a 35 y.o. female she has a hx of IVDU with heroin. She denies heroin use at this time but does inject IV methamphetamine. Patient is somnolent and appears to be intoxicated on Opiates of some kind, although she denies any use. The patient states that she picks at her nailbeds frequently and that her right thumb began hurting a few days ago. It has significantly increased in swelling. She complains of throbbing, burning pain in the thumb. She denies popping or shooting any drugs in her fingers. She denies fevers, chills or other signs of systemic infection.  HPI  Past Medical History:  Diagnosis Date  . Substance abuse     Patient Active Problem List   Diagnosis Date Noted  . Substance induced mood disorder (HCC) 06/19/2015  . Polysubstance abuse     Past Surgical History:  Procedure Laterality Date  . ELBOW FRACTURE SURGERY    . HERNIA REPAIR      OB History    Gravida Para Term Preterm AB Living   1             SAB TAB Ectopic Multiple Live Births                   Home Medications    Prior to Admission medications   Medication Sig Start Date End Date Taking? Authorizing Provider  methadone (DOLOPHINE) 10 MG/ML solution Take 40 mg by mouth daily.    Historical Provider, MD    Family History History reviewed. No pertinent family history.  Social History Social History  Substance Use Topics  . Smoking status: Current Every Day Smoker    Packs/day: 0.50    Types: Cigarettes  . Smokeless tobacco: Not on file  . Alcohol use No     Allergies   Patient has no known allergies.   Review of Systems Review of Systems Ten systems reviewed and are negative for acute change, except as noted in the HPI.    Physical Exam Updated Vital Signs BP 121/71    Pulse 96   Temp 98 F (36.7 C)   Resp 18   Ht 5\' 4"  (1.626 m)   Wt 54.4 kg   LMP 07/17/2015 (LMP Unknown) Comment: pt states no chance of pregancy, but she does not remember when her last menstral cycle was   SpO2 99%   Breastfeeding? Unknown   BMI 20.60 kg/m   Physical Exam  Constitutional: She is oriented to person, place, and time. She appears well-developed and well-nourished. No distress.  HENT:  Head: Normocephalic and atraumatic.  Eyes: Conjunctivae and EOM are normal. Pupils are equal, round, and reactive to light. No scleral icterus.  Pinpoint pupils  Neck: Normal range of motion.  Cardiovascular: Normal rate, regular rhythm and normal heart sounds.  Exam reveals no gallop and no friction rub.   No murmur heard. Pulmonary/Chest: Effort normal and breath sounds normal. No respiratory distress.  Abdominal: Soft. Bowel sounds are normal. She exhibits no distension and no mass. There is no tenderness. There is no guarding.  Musculoskeletal:  Right thumb with large. EKG extending around the medial border of the thumb. She is erythema to the base of the thumb and is exquisitely tender along both the flexor and extensor tendon  area. Mild streaking up the arm streaking noted.  Neurological: She is oriented to person, place, and time.  Somnolent but easily arouses to voice Falls asleep multiple times during the examination making taking pictures of the thumb very difficult. Patient spits on her shirt while sitting up and falling asleep.   Skin: Skin is warm and dry. She is not diaphoretic.  Multiple fresh track marks and injection sites bilateral forearms and hands.  Nursing note and vitals reviewed.          ED Treatments / Results  Labs (all labs ordered are listed, but only abnormal results are displayed) Labs Reviewed - No data to display  EKG  EKG Interpretation None       Radiology Dg Wrist Complete Left  Result Date: 09/11/2016 CLINICAL DATA:  Inability  to fully straighten the left wrist EXAM: LEFT WRIST - COMPLETE 3+ VIEW COMPARISON:  None in PACs FINDINGS: The bones are subjectively adequately mineralized. No acute or healing fracture is observed. There is no lytic or blastic lesion. The joint spaces are well-maintained. Specific attention to the scaphoid reveals no acute bony abnormality. There is mild soft tissue fullness over the dorsum of the wrist. IMPRESSION: No acute bony abnormality of the left wrist is observed. Electronically Signed   By: David  SwazilandJordan M.D.   On: 09/11/2016 07:09   Dg Finger Thumb Right  Result Date: 09/11/2016 CLINICAL DATA:  Pain and burning sensation in the lateral nail bed of the right thumb. Patient reports removing a hangnail several days ago. EXAM: RIGHT THUMB 2+V COMPARISON:  None. FINDINGS: The bones are subjectively adequately mineralized. There is no lytic or blastic lesion or periosteal reaction. No fracture or dislocation is observed. The joint spaces are well maintained. The soft tissues exhibit no foreign bodies or gas collections. IMPRESSION: There is no acute bony abnormality of the right thumb. Electronically Signed   By: David  [place]Jordan M.D.   On: 09/11/2016 07:10    Procedures Drain paronychia Date/Time: 09/11/2016 11:09 AM Performed by: Arthor CaptainHARRIS, Taressa Rauh Authorized by: Arthor CaptainHARRIS, Ralph Brouwer  Consent: Verbal consent obtained. Consent given by: patient Patient identity confirmed: provided demographic data and verbally with patient Time out: Immediately prior to procedure a "time out" was called to verify the correct patient, procedure, equipment, support staff and site/side marked as required. Preparation: Patient was prepped and draped in the usual sterile fashion. Local anesthesia used: yes Anesthesia: digital block  Anesthesia: Local anesthesia used: yes Local Anesthetic: lidocaine 1% with epinephrine Anesthetic total: 9 mL  Sedation: Patient sedated: no Patient tolerance: Patient tolerated the  procedure well with no immediate complications Comments: Incision made with 11 blade. Copious purulent and bloody discharge noted. Wound probed for loculations and flushed.    (including critical care time)  Medications Ordered in ED Medications - No data to display   Initial Impression / Assessment and Plan / ED Course  I have reviewed the triage vital signs and the nursing notes.  Pertinent labs & imaging results that were available during my care of the patient were reviewed by me and considered in my medical decision making (see chart for details).  Clinical Course    Patient with apparent knee daily of the right thumb . I and D completed successfully. Patient bandaged in the ER with concerns and at this given her first dose of oral clindamycin. She is to follow up in the emergency Department in 2 days for a wound recheck. Home care instructions given. Patient is more alert  and appears safe for discharge at this time.   Final Clinical Impressions(s) / ED Diagnoses   Final diagnoses:  Paronychia of right thumb    New Prescriptions New Prescriptions   CLINDAMYCIN (CLEOCIN) 150 MG CAPSULE    Take 2 capsules (300 mg total) by mouth 3 (three) times daily.     Arthor Captainbigail Reta Norgren, PA-C 09/11/16 1120    Derwood KaplanAnkit Nanavati, MD 09/14/16 [id number]    Arthor CaptainAbigail Ronetta Molla, PA-C 10/03/16 1623    Derwood KaplanAnkit Nanavati, MD 10/03/16 [id number]

## 2016-09-11 NOTE — ED Notes (Signed)
ED Provider at bedside. 

## 2016-09-11 NOTE — ED Notes (Signed)
Called for pt with no answer 

## 2016-09-11 NOTE — ED Provider Notes (Signed)
Pt comes in with thumb pain. She has obvious infection - likely paronychia. Pt reports "fidgeting" with her fingers. There is ivda hx, but like patient, I don't think the abscess is directly related to her ivda.  Pt has normal ROM over the thumb as far as flexion/extention/opposition/abduction and adduction are concerned. She does have some redness over the thumb proximal to the nail bed. Passive ROM of the thumb is normal is not significantly uncomfortable. Deep space infection over the thumb is deemed less likely at this time, and therefore we will I&d the abscess in the ER.  I personally went over the risk and benefits of the procedure, and patient has consented verbally to let our PA-C perform the I&D.  Pt aware that the risk of the procedure includes pain, bleeding, peripheral nerve injury, need for further procedure/drainage.   Pt also made aware that if the redness to her thumb spreads proximally, she will need to return to the ER. She will go home with oral antibiotics.  F/U in 2-3 days.   Derwood KaplanAnkit Jolina Symonds, MD 09/11/16 [phone]

## 2016-09-11 NOTE — ED Triage Notes (Addendum)
Pt here for finger pain on right thumb, per pt she fidgets with hang nails often and now swelling and painful. Also wants left wrist injury states cant use tendons and ligaments as she is supposed to. Has bruising and what appears as track marks to same wrist.

## 2016-09-13 LAB — AEROBIC CULTURE  (SUPERFICIAL SPECIMEN)

## 2016-09-13 LAB — AEROBIC CULTURE W GRAM STAIN (SUPERFICIAL SPECIMEN)

## 2016-09-14 ENCOUNTER — Telehealth (HOSPITAL_BASED_OUTPATIENT_CLINIC_OR_DEPARTMENT_OTHER): Payer: Self-pay | Admitting: Emergency Medicine

## 2016-09-14 NOTE — Progress Notes (Signed)
ED Antimicrobial Stewardship Positive Culture Follow Up   Carolyn Frey is an 35 y.o. female who presented to [facility] 09/11/2016 with a chief complaint of  Chief Complaint  Patient presents with  . Finger Injury    Recent Results (from the past 720 hour(s))  Wound or Superficial Culture     Status: None   Collection Time: 09/11/16 11:00 AM  Result Value Ref Range Status   Specimen Description THUMB  Final   Special Requests IVDU  Final   Gram Stain   Final    FEW WBC PRESENT,BOTH PMN AND MONONUCLEAR RARE SQUAMOUS EPITHELIAL CELLS PRESENT FEW GRAM POSITIVE COCCI IN CLUSTERS    Culture   Final    MODERATE METHICILLIN RESISTANT STAPHYLOCOCCUS AUREUS   Report Status 09/13/2016 FINAL  Final   Organism ID, Bacteria METHICILLIN RESISTANT STAPHYLOCOCCUS AUREUS  Final      Susceptibility   Methicillin resistant staphylococcus aureus - MIC*    CIPROFLOXACIN >=8 RESISTANT Resistant     ERYTHROMYCIN >=8 RESISTANT Resistant     GENTAMICIN <=0.5 SENSITIVE Sensitive     OXACILLIN >=4 RESISTANT Resistant     TETRACYCLINE >=16 RESISTANT Resistant     VANCOMYCIN <=0.5 SENSITIVE Sensitive     TRIMETH/SULFA 160 RESISTANT Resistant     CLINDAMYCIN >=8 RESISTANT Resistant     RIFAMPIN <=0.5 SENSITIVE Sensitive     Inducible Clindamycin NEGATIVE Sensitive     * MODERATE METHICILLIN RESISTANT STAPHYLOCOCCUS AUREUS    [x]  Treated with Clindamycin, organism resistant to prescribed antimicrobial.   New antibiotic prescription: Linezolid 600 mg po q12h x 10 days  ED Provider: Melburn HakeNicole Nadeau, PA-C  Allie BossierApryl Anderson, PharmD PGY1 Pharmacy Resident [phone] (Pager) 09/14/2016 9:36 AM

## 2016-09-14 NOTE — Telephone Encounter (Signed)
Post ED Visit - Positive Culture Follow-up: Successful Patient Follow-Up  Culture assessed and recommendations reviewed by: []  Enzo BiNathan Batchelder, Pharm.D. []  Celedonio MiyamotoJeremy Frens, Pharm.D., BCPS []  Garvin FilaMike Maccia, Pharm.D. []  Georgina PillionElizabeth Martin, Pharm.D., BCPS []  HazeltonMinh Pham, [address]Pharm[address]D., BCPS, AAHIVP []  Estella HuskMichelle Turner, Pharm.D., BCPS, AAHIVP []  Tennis Mustassie Stewart, Pharm.D. []  Rob Oswaldo DoneVincent, [address]Pharm[address] Allie BossierApryl Anderson PharmD  Positive wound culture  []  Patient discharged without antimicrobial prescription and treatment is now indicated [x]  Organism is resistant to prescribed ED discharge antimicrobial []  Patient with positive blood cultures  Changes discussed with ED provider: Melburn HakeNicole Nadeau PA New antibiotic prescription stop clindamycin and start Linezolid 600mg  po q 12h x 10 days  Attempting to contact patient   Berle MullMiller, Jaylani Mcguinn 09/14/2016, 4:02 PM

## 2016-10-22 ENCOUNTER — Encounter (HOSPITAL_BASED_OUTPATIENT_CLINIC_OR_DEPARTMENT_OTHER): Payer: Self-pay | Admitting: Emergency Medicine

## 2016-10-22 ENCOUNTER — Emergency Department (HOSPITAL_BASED_OUTPATIENT_CLINIC_OR_DEPARTMENT_OTHER): Payer: No Typology Code available for payment source

## 2016-10-22 ENCOUNTER — Emergency Department (HOSPITAL_BASED_OUTPATIENT_CLINIC_OR_DEPARTMENT_OTHER)
Admission: EM | Admit: 2016-10-22 | Discharge: 2016-10-23 | Disposition: A | Discharge status: Home / Self Care | Payer: No Typology Code available for payment source | Attending: Emergency Medicine | Admitting: Emergency Medicine

## 2016-10-22 DIAGNOSIS — T07XXXA Unspecified multiple injuries, initial encounter: Secondary | ICD-10-CM

## 2016-10-22 DIAGNOSIS — M549 Dorsalgia, unspecified: Secondary | ICD-10-CM | POA: Insufficient documentation

## 2016-10-22 DIAGNOSIS — F17210 Nicotine dependence, cigarettes, uncomplicated: Secondary | ICD-10-CM | POA: Insufficient documentation

## 2016-10-22 DIAGNOSIS — Y929 Unspecified place or not applicable: Secondary | ICD-10-CM | POA: Insufficient documentation

## 2016-10-22 DIAGNOSIS — Z23 Encounter for immunization: Secondary | ICD-10-CM | POA: Insufficient documentation

## 2016-10-22 DIAGNOSIS — Y999 Unspecified external cause status: Secondary | ICD-10-CM | POA: Insufficient documentation

## 2016-10-22 DIAGNOSIS — L03213 Periorbital cellulitis: Secondary | ICD-10-CM | POA: Insufficient documentation

## 2016-10-22 DIAGNOSIS — T148XXA Other injury of unspecified body region, initial encounter: Secondary | ICD-10-CM | POA: Insufficient documentation

## 2016-10-22 DIAGNOSIS — Y939 Activity, unspecified: Secondary | ICD-10-CM | POA: Insufficient documentation

## 2016-10-22 LAB — COMPREHENSIVE METABOLIC PANEL
ALT: 17 U/L (ref 14–54)
AST: 24 U/L (ref 15–41)
Albumin: 3.2 g/dL — ABNORMAL LOW (ref 3.5–5.0)
Alkaline Phosphatase: 73 U/L (ref 38–126)
Anion gap: 8 (ref 5–15)
BUN: 7 mg/dL (ref 6–20)
CO2: 30 mmol/L (ref 22–32)
Calcium: 8.7 mg/dL — ABNORMAL LOW (ref 8.9–10.3)
Chloride: 95 mmol/L — ABNORMAL LOW (ref 101–111)
Creatinine, Ser: 0.79 mg/dL (ref 0.44–1.00)
GFR calc Af Amer: >60 mL/min (ref >60)
GFR calc non Af Amer: >60 mL/min (ref >60)
Glucose, Bld: 138 mg/dL — ABNORMAL HIGH (ref 65–99)
Potassium: 3.6 mmol/L (ref 3.5–5.1)
Sodium: 133 mmol/L — ABNORMAL LOW (ref 135–145)
Total Bilirubin: 0.2 mg/dL — ABNORMAL LOW (ref 0.3–1.2)
Total Protein: 6.4 g/dL — ABNORMAL LOW (ref 6.5–8.1)

## 2016-10-22 LAB — CBC WITH DIFFERENTIAL/PLATELET
Basophils Absolute: 0 10*3/uL (ref 0.0–0.1)
Basophils Relative: 0 %
Eosinophils Absolute: 0.7 10*3/uL (ref 0.0–0.7)
Eosinophils Relative: 5 %
HCT: 30.4 % — ABNORMAL LOW (ref 36.0–46.0)
Hemoglobin: 9.5 g/dL — ABNORMAL LOW (ref 12.0–15.0)
Lymphocytes Relative: 17 %
Lymphs Abs: 2.2 10*3/uL (ref 0.7–4.0)
MCH: 27.4 pg (ref 26.0–34.0)
MCHC: 31.3 g/dL (ref 30.0–36.0)
MCV: 87.6 fL (ref 78.0–100.0)
Monocytes Absolute: 0.8 10*3/uL (ref 0.1–1.0)
Monocytes Relative: 6 %
Neutro Abs: 9.4 10*3/uL — ABNORMAL HIGH (ref 1.7–7.7)
Neutrophils Relative %: 72 %
Platelets: 488 10*3/uL — ABNORMAL HIGH (ref 150–400)
RBC: 3.47 MIL/uL — ABNORMAL LOW (ref 3.87–5.11)
RDW: 13.2 % (ref 11.5–15.5)
WBC: 13.2 10*3/uL — ABNORMAL HIGH (ref 4.0–10.5)

## 2016-10-22 LAB — I-STAT CG4 LACTIC ACID, ED: Lactic Acid, Venous: 2.22 mmol/L (ref 0.5–1.9)

## 2016-10-22 MED ORDER — KETOROLAC TROMETHAMINE 15 MG/ML IJ SOLN
15.0000 mg | Freq: Once | INTRAMUSCULAR | Status: AC
  Administered 2016-10-23: 15 mg via INTRAVENOUS
  Filled 2016-10-22: qty 1

## 2016-10-22 MED ORDER — CLINDAMYCIN PHOSPHATE 600 MG/50ML IV SOLN
600.0000 mg | Freq: Once | INTRAVENOUS | Status: AC
  Administered 2016-10-23: 600 mg via INTRAVENOUS
  Filled 2016-10-22: qty 50

## 2016-10-22 MED ORDER — SODIUM CHLORIDE 0.9 % IV BOLUS (SEPSIS)
1000.0000 mL | Freq: Once | INTRAVENOUS | Status: AC
  Administered 2016-10-22: 1000 mL via INTRAVENOUS

## 2016-10-22 MED ORDER — TETANUS-DIPHTH-ACELL PERTUSSIS 5-2.5-18.5 LF-MCG/0.5 IM SUSP
0.5000 mL | Freq: Once | INTRAMUSCULAR | Status: AC
  Administered 2016-10-23: 0.5 mL via INTRAMUSCULAR
  Filled 2016-10-22: qty 0.5

## 2016-10-22 MED ORDER — HYDROMORPHONE HCL 1 MG/ML IJ SOLN
1.0000 mg | Freq: Once | INTRAMUSCULAR | Status: AC
  Administered 2016-10-23: 1 mg via INTRAVENOUS
  Filled 2016-10-22: qty 1
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
## 2016-10-22 NOTE — ED Notes (Signed)
[facility]

## 2016-10-22 NOTE — ED Notes (Signed)
Back from b/r, steady gait, returned to monitor, [facility], [employer] remains present, "unable to give urine sample", IVF infusing.
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
## 2016-10-22 NOTE — ED Notes (Signed)
Attempted IV to LAC, unsuccessful, tol well. Admits to IV drug abuse with last use of heroin today. Scars and various scabbed areas noted to hands and arms.

## 2016-10-22 NOTE — ED Notes (Signed)
Up to b/r, steady gait, NAD, calm, rates pain 9/10, denies dizziness or nausea.
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
## 2016-10-23 ENCOUNTER — Telehealth (HOSPITAL_BASED_OUTPATIENT_CLINIC_OR_DEPARTMENT_OTHER): Payer: Self-pay

## 2016-10-23 LAB — URINALYSIS, ROUTINE W REFLEX MICROSCOPIC
Bilirubin Urine: NEGATIVE
Glucose, UA: NEGATIVE mg/dL
Hgb urine dipstick: NEGATIVE
Ketones, ur: NEGATIVE mg/dL
Leukocytes, UA: NEGATIVE
Nitrite: NEGATIVE
Protein, ur: NEGATIVE mg/dL
Specific Gravity, Urine: 1.022 (ref 1.005–1.030)
pH: 7 (ref 5.0–8.0)

## 2016-10-23 LAB — I-STAT CG4 LACTIC ACID, ED: Lactic Acid, Venous: 0.59 mmol/L (ref 0.5–1.9)

## 2016-10-23 LAB — PREGNANCY, URINE: Preg Test, Ur: NEGATIVE

## 2016-10-23 MED ORDER — CLINDAMYCIN HCL 150 MG PO CAPS: 450.0000 mg | ORAL_CAPSULE | Freq: Three times a day (TID) | ORAL | 0 refills | Status: DC

## 2016-10-23 MED ORDER — IOPAMIDOL (ISOVUE-300) INJECTION 61%
100.0000 mL | Freq: Once | INTRAVENOUS | Status: AC | PRN
  Administered 2016-10-23: 100 mL via INTRAVENOUS

## 2016-10-23 NOTE — Telephone Encounter (Signed)
Per Dr. Ethelda ChickJacubowitz, Rx for Clindamycin 150mg  3 tabs (total 450mg ) TID x 7 days. Dispense 63. No refills. Per pt request rx called in to [facility] at the [address] of [address] [place] [phone]).
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
## 2016-10-23 NOTE — ED Notes (Addendum)
No changes, ambulatory up to b/r, steady gait, friend present. L eye less swollen, able to now open.

## 2016-10-23 NOTE — Telephone Encounter (Signed)
Pt called stating she was seen in ED last night and lost rx for abx-pt gave [phone] as return ph #-reviewed chart-clindamycin was abx rx in chart-no disp #-Dr Ethelda ChickJacubowitz reviewed chart-orders for clindamycin 150mg  x 3 tabs tid x 7 days-called given ph # x 2 to obtain call in rx info-recording that no voicemail has been set up for this ph #
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
## 2016-10-23 NOTE — ED Notes (Signed)
CSI here for photos
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
## 2016-10-23 NOTE — ED Notes (Signed)
To radiology

## 2017-03-21 ENCOUNTER — Emergency Department (HOSPITAL_COMMUNITY)
Admission: EM | Admit: 2017-03-21 | Discharge: 2017-03-21 | Discharge status: Against Medical Advice | Payer: Self-pay | Attending: Emergency Medicine | Admitting: Emergency Medicine

## 2017-03-21 ENCOUNTER — Emergency Department (HOSPITAL_COMMUNITY): Payer: Self-pay

## 2017-03-21 ENCOUNTER — Encounter (HOSPITAL_COMMUNITY): Payer: Self-pay

## 2017-03-21 DIAGNOSIS — F17210 Nicotine dependence, cigarettes, uncomplicated: Secondary | ICD-10-CM | POA: Insufficient documentation

## 2017-03-21 DIAGNOSIS — M7989 Other specified soft tissue disorders: Secondary | ICD-10-CM

## 2017-03-21 DIAGNOSIS — M79662 Pain in left lower leg: Secondary | ICD-10-CM

## 2017-03-21 DIAGNOSIS — L03116 Cellulitis of left lower limb: Secondary | ICD-10-CM

## 2017-03-21 DIAGNOSIS — L0291 Cutaneous abscess, unspecified: Secondary | ICD-10-CM

## 2017-03-21 DIAGNOSIS — L02416 Cutaneous abscess of left lower limb: Secondary | ICD-10-CM | POA: Insufficient documentation

## 2017-03-21 LAB — COMPREHENSIVE METABOLIC PANEL
ALT: 19 U/L (ref 14–54)
AST: 22 U/L (ref 15–41)
Albumin: 3.5 g/dL (ref 3.5–5.0)
Alkaline Phosphatase: 85 U/L (ref 38–126)
Anion gap: 12 (ref 5–15)
BUN: 5 mg/dL — AB (ref 6–20)
CHLORIDE: 97 mmol/L — AB (ref 101–111)
CO2: 26 mmol/L (ref 22–32)
Calcium: 8.9 mg/dL (ref 8.9–10.3)
Creatinine, Ser: 0.83 mg/dL (ref 0.44–1.00)
GFR calc Af Amer: >60 mL/min (ref >60)
Glucose, Bld: 105 mg/dL — ABNORMAL HIGH (ref 65–99)
Potassium: 3.6 mmol/L (ref 3.5–5.1)
Sodium: 135 mmol/L (ref 135–145)
Total Bilirubin: 0.7 mg/dL (ref 0.3–1.2)
Total Protein: 7.6 g/dL (ref 6.5–8.1)

## 2017-03-21 LAB — CBC WITH DIFFERENTIAL/PLATELET
Basophils Absolute: 0 10*3/uL (ref 0.0–0.1)
Basophils Relative: 0 %
EOS ABS: 0.1 10*3/uL (ref 0.0–0.7)
EOS PCT: 1 %
HCT: 33.5 % — ABNORMAL LOW (ref 36.0–46.0)
Hemoglobin: 10.6 g/dL — ABNORMAL LOW (ref 12.0–15.0)
LYMPHS ABS: 2.7 10*3/uL (ref 0.7–4.0)
Lymphocytes Relative: 24 %
MCH: 25.9 pg — AB (ref 26.0–34.0)
MCHC: 31.6 g/dL (ref 30.0–36.0)
MCV: 81.7 fL (ref 78.0–100.0)
MONOS PCT: 5 %
Monocytes Absolute: 0.6 10*3/uL (ref 0.1–1.0)
Neutro Abs: 7.9 10*3/uL — ABNORMAL HIGH (ref 1.7–7.7)
Neutrophils Relative %: 70 %
PLATELETS: 493 10*3/uL — AB (ref 150–400)
RBC: 4.1 MIL/uL (ref 3.87–5.11)
RDW: 14.8 % (ref 11.5–15.5)
WBC: 11.3 10*3/uL — AB (ref 4.0–10.5)

## 2017-03-21 LAB — I-STAT CG4 LACTIC ACID, ED
LACTIC ACID, VENOUS: 0.81 mmol/L (ref 0.5–1.9)
LACTIC ACID, VENOUS: 0.93 mmol/L (ref 0.5–1.9)

## 2017-03-21 MED ORDER — HYDROMORPHONE HCL 1 MG/ML IJ SOLN
1.0000 mg | Freq: Once | INTRAMUSCULAR | Status: AC
  Administered 2017-03-21: 1 mg via INTRAVENOUS
  Filled 2017-03-21: qty 1

## 2017-03-21 MED ORDER — CLINDAMYCIN HCL 150 MG PO CAPS: 300.0000 mg | ORAL_CAPSULE | Freq: Four times a day (QID) | ORAL | 0 refills | Status: DC

## 2017-03-21 MED ORDER — SODIUM CHLORIDE 0.9 % IV BOLUS (SEPSIS)
1000.0000 mL | Freq: Once | INTRAVENOUS | Status: AC
  Administered 2017-03-21: 1000 mL via INTRAVENOUS

## 2017-03-21 MED ORDER — ONDANSETRON HCL 4 MG/2ML IJ SOLN
4.0000 mg | Freq: Once | INTRAMUSCULAR | Status: AC
  Administered 2017-03-21: 4 mg via INTRAVENOUS
  Filled 2017-03-21: qty 2

## 2017-03-21 MED ORDER — IOPAMIDOL (ISOVUE-300) INJECTION 61%
INTRAVENOUS | Status: AC
  Administered 2017-03-21: 100 mL
  Filled 2017-03-21: qty 100

## 2017-03-21 MED ORDER — LIDOCAINE HCL 2 % IJ SOLN
10.0000 mL | Freq: Once | INTRAMUSCULAR | Status: AC
  Administered 2017-03-21: 200 mg
  Filled 2017-03-21: qty 20

## 2017-03-21 MED ORDER — VANCOMYCIN HCL IN DEXTROSE 1-5 GM/200ML-% IV SOLN
1000.0000 mg | Freq: Once | INTRAVENOUS | Status: AC
  Administered 2017-03-21: 1000 mg via INTRAVENOUS
  Filled 2017-03-21: qty 200

## 2017-03-21 MED ORDER — TRAMADOL HCL 50 MG PO TABS: 50.0000 mg | ORAL_TABLET | Freq: Four times a day (QID) | ORAL | 0 refills | Status: DC | PRN

## 2017-03-21 NOTE — ED Notes (Signed)
Pt was not in room when this RN went in with dc papers.  This RN and Mellody DanceKeith, EMS, looked throughout dept for pt.  This RN unable to locate pt.  This RN called contact phone in chart, [phone], unable to reach pt.

## 2017-03-21 NOTE — ED Provider Notes (Signed)
[facility]-EMERGENCY DEPT Provider Note   CSN: [id number] Arrival date & time: 03/21/17  0020  By signing my name below, I, Carolyn Frey, attest that this documentation has been prepared under the direction and in the presence of Koni Kannan, Canary Brim, MD. Electronically Signed: Rosario Frey, ED Scribe. 03/21/17. 3:40 AM.  History   Chief Complaint Chief Complaint  Patient presents with  . Leg Swelling   The history is provided by the patient. No language interpreter was used.    HPI Comments: Carolyn Frey is a 36 y.o. female with a h/o substance abuse, who presents to the Emergency Department complaining of left leg swelling beginning several weeks ago, acutely worsening tonight prior to arrival. Per pt, several weeks ago she could feel a small "pea-sized" knot to the left posterior calf only upon palpation. Tonight she noticed that this had acutely worsened with increased pain and swelling extending downward into the leg. She denies any recent trauma/injury to the extremity or visualized insect/tick bites. She states that her pain is worse with ambulation, palpation, and weight bearing of the extremity. She denies weakness, or any other associated symptoms.   Past Medical History:  Diagnosis Date  . Substance abuse    Patient Active Problem List   Diagnosis Date Noted  . Substance induced mood disorder (HCC) 06/19/2015  . Polysubstance abuse    Past Surgical History:  Procedure Laterality Date  . ELBOW FRACTURE SURGERY    . HERNIA REPAIR     OB History    Gravida Para Term Preterm AB Living   1             SAB TAB Ectopic Multiple Live Births                 Home Medications    Prior to Admission medications   Medication Sig Start Date End Date Taking? Authorizing Provider  clindamycin (CLEOCIN) 150 MG capsule Take 2 capsules (300 mg total) by mouth 4 (four) times daily. 03/21/17   Gilda Crease, MD   Family History History reviewed. No pertinent  family history.  Social History Social History  Substance Use Topics  . Smoking status: Current Every Day Smoker    Packs/day: 0.50    Types: Cigarettes  . Smokeless tobacco: Never Used  . Alcohol use No   Allergies   Patient has no known allergies.  Review of Systems Review of Systems  Cardiovascular: Positive for leg swelling.  Musculoskeletal: Positive for myalgias.  Neurological: Negative for weakness.  All other systems reviewed and are negative.  Physical Exam Updated Vital Signs BP 100/74   Pulse (!) 102   Temp 100.2 F (37.9 C) (Oral)   Resp (!) 26   Ht 5\' 4"  (1.626 m)   Wt 59 kg (130 lb)   SpO2 91%   BMI 22.31 kg/m   Physical Exam  Constitutional: She is oriented to person, place, and time. She appears well-developed and well-nourished. No distress.  HENT:  Head: Normocephalic and atraumatic.  Right Ear: Hearing normal.  Left Ear: Hearing normal.  Nose: Nose normal.  Mouth/Throat: Oropharynx is clear and moist and mucous membranes are normal.  Eyes: Conjunctivae and EOM are normal. Pupils are equal, round, and reactive to light.  Neck: Normal range of motion. Neck supple.  Cardiovascular: Regular rhythm, S1 normal and S2 normal.  Exam reveals no gallop and no friction rub.   No murmur heard. Pulmonary/Chest: Effort normal and breath sounds normal. No respiratory distress.  She exhibits no tenderness.  Abdominal: Soft. Normal appearance and bowel sounds are normal. There is no hepatosplenomegaly. There is no tenderness. There is no rebound, no guarding, no tenderness at McBurney's point and negative Murphy's sign. No hernia.  Musculoskeletal: Normal range of motion. She exhibits edema.  Significant left lower leg swelling with tenderness, erythema, warmth, and induration of the calf region. Moderate non-pitting pedal edema.   Neurological: She is alert and oriented to person, place, and time. She has normal strength. No cranial nerve deficit or sensory  deficit. Coordination normal. GCS eye subscore is 4. GCS verbal subscore is 5. GCS motor subscore is 6.  Skin: Skin is warm, dry and intact. No rash noted. There is erythema. No cyanosis.  Psychiatric: She has a normal mood and affect. Her speech is normal and behavior is normal. Thought content normal.  Nursing note and vitals reviewed.  ED Treatments / Results  DIAGNOSTIC STUDIES: Oxygen Saturation is 98% on RA, normal by my interpretation.   COORDINATION OF CARE: 3:40 AM-Discussed next steps with pt. Pt verbalized understanding and is agreeable with the plan.   Labs (all labs ordered are listed, but only abnormal results are displayed) Labs Reviewed  COMPREHENSIVE METABOLIC PANEL - Abnormal; Notable for the following:       Result Value   Chloride 97 (*)    Glucose, Bld 105 (*)    BUN 5 (*)    All other components within normal limits  CBC WITH DIFFERENTIAL/PLATELET - Abnormal; Notable for the following:    WBC 11.3 (*)    Hemoglobin 10.6 (*)    HCT 33.5 (*)    MCH 25.9 (*)    Platelets 493 (*)    Neutro Abs 7.9 (*)    All other components within normal limits  I-STAT CG4 LACTIC ACID, ED  I-STAT CG4 LACTIC ACID, ED   EKG  EKG Interpretation None      Radiology Ct Tibia Fibula Left W Contrast  Result Date: 03/21/2017 CLINICAL DATA:  Left lower leg pain and swelling. History of substance abuse. EXAM: CT OF THE LOWER RIGHT EXTREMITY WITH CONTRAST TECHNIQUE: Multidetector CT imaging of the lower right extremity was performed according to the standard protocol following intravenous contrast administration. COMPARISON:  None. CONTRAST:  ISOVUE-300 IOPAMIDOL (ISOVUE-300) INJECTION 61% FINDINGS: Bones/Joint/Cartilage No fracture, periosteal reaction or bony destructive change. Knee and ankle alignment is maintained. Ligaments Suboptimally assessed by CT. Muscles and Tendons Peripherally enhancing irregular 3.8 x 2.9 x 2.5 cm fluid collection within proximal aspect of medial  gastrocnemius muscle consistent with abscess. Central 7 mm linear foreign body may be a needle tip. There is associated muscle edema. Fascial fluid tracks distally of the lower leg. No intramuscular air. Normal fatty striations persist. Soft tissues Edema about the posteromedial soft tissues, most prominent adjacent intramuscular fluid collection. Diffuse soft tissue edema tracks distally. IMPRESSION: Intramuscular 3.8 cm fluid collection within proximal medial gastrocnemius muscle consistent with abscess. There is 7 mm linear foreign body within the central collection, possible needle tip. Electronically Signed   By: Rubye Oaks M.D.   On: 03/21/2017 05:35    Procedures Procedures   Medications Ordered in ED Medications  sodium chloride 0.9 % bolus 1,000 mL (0 mLs Intravenous Stopped 03/21/17 0616)  HYDROmorphone (DILAUDID) injection 1 mg (1 mg Intravenous Given 03/21/17 0406)  ondansetron (ZOFRAN) injection 4 mg (4 mg Intravenous Given 03/21/17 0406)  iopamidol (ISOVUE-300) 61 % injection (100 mLs  Contrast Given 03/21/17 0400)  lidocaine (XYLOCAINE)  2 % (with pres) injection 200 mg (200 mg Infiltration Given by Other 03/21/17 0616)  vancomycin (VANCOCIN) IVPB 1000 mg/200 mL premix (1,000 mg Intravenous New Bag/Given 03/21/17 0613)    Initial Impression / Assessment and Plan / ED Course  I have reviewed the triage vital signs and the nursing notes.  Pertinent labs & imaging results that were available during my care of the patient were reviewed by me and considered in my medical decision making (see chart for details).     Patient presents to the emergency department for evaluation of painful swollen left leg. Examination of the posterior calf region revealed erythema, warmth, induration. 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 She underwent CT scan to evaluate for the possibility of abscess. There is a nearly 4 cm abscess present. There appears to be a linear metallic foreign body within the  abscess cavity, suspicious for needle tip. Patient is adamant that she has not broken any needles and no one else injects her.  After some time I was able to convince her to allow start an IV to administer IV vancomycin. Patient does not appear to be septic at this time. I recommended admission after drainage of the abscess, but patient has declined. I did discuss with her the possibility of evolving sepsis because of becoming sicker, having primary disability or even death secondary to this infection. She understands she could lose her leg. She declines admission.  Incision and drainage will be performed by Audry Piliyler Mohr, PA-C. Patient will be discharged on clindamycin and will be instructed to return to medially for any worsening symptoms.  Final Clinical Impressions(s) / ED Diagnoses   Final diagnoses:  Pain and swelling of left lower leg  Abscess  Cellulitis of left lower extremity   New Prescriptions New Prescriptions   CLINDAMYCIN (CLEOCIN) 150 MG CAPSULE    Take 2 capsules (300 mg total) by mouth 4 (four) times daily.   I personally performed the services described in this documentation, which was scribed in my presence. The recorded information has been reviewed and is accurate.     Gilda CreasePollina, Tahara Ruffini J, MD 03/21/17 [phone]

## 2017-03-21 NOTE — ED Triage Notes (Signed)
Acuity changed due to negative lactic

## 2017-03-21 NOTE — ED Triage Notes (Signed)
Pt here for right leg significantly more swollen compared to left leg. Unknown cause. Denies travel or injury. Warm to touch and pt with low grade fever sts started as small pea size area a few weeks ago.

## 2017-03-21 NOTE — ED Provider Notes (Signed)
  Physical Exam  BP 100/74   Pulse (!) 102   Temp 100.2 F (37.9 C) (Oral)   Resp (!) 26   Ht 5\' 4"  (1.626 m)   Wt 59 kg (130 lb)   SpO2 91%   BMI 22.31 kg/m   Physical Exam  ED Course  .Marland Kitchen.Incision and Drainage Date/Time: 03/21/2017 8:05 AM Performed by: Audry PiliMOHR, Zealand Boyett Authorized by: Audry PiliMOHR, Chioma Mukherjee   Consent:    Consent obtained:  Verbal   Consent given by:  Patient   Risks discussed:  Bleeding, incomplete drainage and infection   Alternatives discussed:  Delayed treatment Location:    Type:  Abscess   Location:  Lower extremity   Lower extremity location:  Leg   Leg location:  L lower leg Pre-procedure details:    Skin preparation:  Betadine Anesthesia (see MAR for exact dosages):    Anesthesia method:  Local infiltration   Local anesthetic:  Lidocaine 2% w/o epi Procedure type:    Complexity:  Simple Procedure details:    Needle aspiration: yes     Needle size:  18 G   Incision types:  Stab incision and single straight   Incision depth:  Dermal   Scalpel blade:  11   Wound management:  Probed and deloculated and irrigated with saline   Drainage:  Bloody   Drainage amount:  Scant   Wound treatment:  Wound left open Post-procedure details:    Patient tolerance of procedure:  Tolerated well, no immediate complications Comments:     Wound packed.      Audry PiliMohr, Jem Castro, PA-C 03/21/17 [id number]    Gilda CreasePollina, Christopher J, MD 03/22/17 [phone]

## 2017-03-21 NOTE — ED Notes (Signed)
ED Provider at bedside. 

## 2017-03-23 ENCOUNTER — Emergency Department (HOSPITAL_COMMUNITY)
Admission: EM | Admit: 2017-03-23 | Discharge: 2017-03-23 | Disposition: A | Discharge status: Home / Self Care | Payer: Self-pay | Attending: Emergency Medicine | Admitting: Emergency Medicine

## 2017-03-23 ENCOUNTER — Encounter (HOSPITAL_COMMUNITY): Payer: Self-pay | Admitting: Emergency Medicine

## 2017-03-23 DIAGNOSIS — Z5189 Encounter for other specified aftercare: Secondary | ICD-10-CM

## 2017-03-23 DIAGNOSIS — L03116 Cellulitis of left lower limb: Secondary | ICD-10-CM | POA: Insufficient documentation

## 2017-03-23 DIAGNOSIS — F17210 Nicotine dependence, cigarettes, uncomplicated: Secondary | ICD-10-CM | POA: Insufficient documentation

## 2017-03-23 MED ORDER — LIDOCAINE-EPINEPHRINE 2 %-1:100000 IJ SOLN
20.0000 mL | Freq: Once | INTRAMUSCULAR | Status: AC
  Administered 2017-03-23: 20 mL
  Filled 2017-03-23: qty 20

## 2017-03-23 MED ORDER — TRAMADOL HCL 50 MG PO TABS: 50.0000 mg | ORAL_TABLET | Freq: Four times a day (QID) | ORAL | 0 refills | Status: AC | PRN

## 2017-03-23 MED ORDER — DIPHENHYDRAMINE HCL 50 MG/ML IJ SOLN
25.0000 mg | Freq: Once | INTRAMUSCULAR | Status: AC
  Administered 2017-03-23: 25 mg via INTRAVENOUS
  Filled 2017-03-23: qty 1

## 2017-03-23 MED ORDER — CLINDAMYCIN HCL 150 MG PO CAPS: 300.0000 mg | ORAL_CAPSULE | Freq: Four times a day (QID) | ORAL | 0 refills | Status: DC

## 2017-03-23 MED ORDER — CLINDAMYCIN PHOSPHATE 600 MG/50ML IV SOLN
600.0000 mg | Freq: Once | INTRAVENOUS | Status: AC
  Administered 2017-03-23: 600 mg via INTRAVENOUS
  Filled 2017-03-23: qty 50

## 2017-03-23 MED ORDER — MORPHINE SULFATE (PF) 4 MG/ML IV SOLN
4.0000 mg | Freq: Once | INTRAVENOUS | Status: AC
  Administered 2017-03-23: 4 mg via INTRAVENOUS
  Filled 2017-03-23: qty 1

## 2017-03-23 NOTE — ED Provider Notes (Signed)
[facility]-EMERGENCY DEPT Provider Note   CSN: [id number] Arrival date & time: 03/23/17  1635  By signing my name below, I, Carolyn Frey, attest that this documentation has been prepared under the direction and in the presence of Fayrene HelperBowie Raudel Bazen, PA-C. Electronically Signed: Orpah CobbMaurice Frey , ED Scribe. 03/23/17. 5:33 PM.   History   Chief Complaint Chief Complaint  Patient presents with  . Wound Check    HPI Carolyn Frey is a 36 y.o. female who presents to the Emergency Department complaining of constant, L calf wound check with onset x2 days. Pt states that she had an abscess excised and drained x2 days ago and was advised to return to the ED for a recheck today. She states that since the excision, the swelling has improved but the pain has persisted. She reports the wound being packed while in the ED but states that she left the ED not knowing that she was prescribed antibiotics. Pt reports L calf pain, chills. She has changed dressings with no relief. Pt denies fever, nausea, vomiting. Pt denies warm water soaks. Of note, Pt's tetanus is UTD.    The history is provided by the patient. No language interpreter was used.  6  Past Medical History:  Diagnosis Date  . Substance abuse     Patient Active Problem List   Diagnosis Date Noted  . Substance induced mood disorder (HCC) 06/19/2015  . Polysubstance abuse     Past Surgical History:  Procedure Laterality Date  . ELBOW FRACTURE SURGERY    . HERNIA REPAIR      OB History    Gravida Para Term Preterm AB Living   1             SAB TAB Ectopic Multiple Live Births                   Home Medications    Prior to Admission medications   Medication Sig Start Date End Date Taking? Authorizing Provider  clindamycin (CLEOCIN) 150 MG capsule Take 2 capsules (300 mg total) by mouth 4 (four) times daily. 03/21/17   Gilda CreasePollina, Christopher J, MD  traMADol (ULTRAM) 50 MG tablet Take 1 tablet (50 mg total) by mouth every 6 (six) hours  as needed. 03/21/17   Gilda CreasePollina, Christopher J, MD    Family History No family history on file.  Social History Social History  Substance Use Topics  . Smoking status: Current Every Day Smoker    Packs/day: 0.50    Types: Cigarettes  . Smokeless tobacco: Never Used  . Alcohol use No     Allergies   Patient has no known allergies.   Review of Systems Review of Systems  Constitutional: Positive for chills. Negative for fever.  Musculoskeletal: Positive for myalgias (L calf).  Skin: Positive for wound (L calf).     Physical Exam Updated Vital Signs BP 102/68 (BP Location: Left Arm)   Pulse 94   Temp 98 F (36.7 C) (Oral)   Resp 14   Ht 5\' 4"  (1.626 m)   Wt 130 lb 3.2 oz (59.1 kg)   LMP  (LMP Unknown)   SpO2 99%   BMI 22.35 kg/m   Physical Exam  Constitutional: She appears well-developed and well-nourished. No distress.  HENT:  Head: Normocephalic and atraumatic.  Eyes: Conjunctivae are normal.  Neck: Neck supple.  Cardiovascular: Normal rate and regular rhythm.   No murmur heard. Dorsalis pedis pulse is palpable.   Pulmonary/Chest: Effort normal and breath sounds  normal. No respiratory distress.  Abdominal: Soft. There is no tenderness.  Musculoskeletal: She exhibits no edema.       Left lower leg: She exhibits tenderness.  Neurological: She is alert.  Skin: Skin is warm and dry. There is erythema.  L calf with moderate sized area of induration with surrounding erythema approximately 4x6 inches in diameter that is noncircumferential but exquisitely TTP. No obvious fluctuance noted. Compartment is soft.  Psychiatric: She has a normal mood and affect.  Nursing note and vitals reviewed.    ED Treatments / Results   DIAGNOSTIC STUDIES: Oxygen Saturation is 99% on RA, normal by my interpretation.   COORDINATION OF CARE: 5:36 PM-Discussed next steps with pt. Pt verbalized understanding and is agreeable with the plan.    Labs (all labs ordered are listed,  but only abnormal results are displayed) Labs Reviewed - No data to display  EKG  EKG Interpretation None       Radiology No results found.  Procedures .Marland KitchenIncision and Drainage Date/Time: 03/23/2017 7:01 PM Performed by: [name] Authorized by: [name]   Consent:    Consent obtained:  Verbal   Consent given by:  Patient   Risks discussed:  Incomplete drainage and pain   Alternatives discussed:  No treatment, observation and referral Location:    Type:  Abscess   Size:  1cm   Location:  Lower extremity   Lower extremity location:  Leg   Leg location:  L lower leg Pre-procedure details:    Skin preparation:  Betadine Anesthesia (see MAR for exact dosages):    Anesthesia method:  Local infiltration   Local anesthetic:  Lidocaine 2% WITH epi Procedure type:    Complexity:  Complex Procedure details:    Needle aspiration: no     Incision types:  Stab incision   Incision depth:  Subcutaneous   Scalpel blade:  11   Wound management:  Probed and deloculated   Drainage:  Serosanguinous   Drainage amount:  Scant   Wound treatment:  Wound left open   Packing materials:  1/4 in iodoform gauze   Amount 1/4" iodoform:  15 cm Post-procedure details:    Patient tolerance of procedure:  Tolerated well, no immediate complications   (including critical care time)    Medications Ordered in ED Medications - No data to display   Initial Impression / Assessment and Plan / ED Course  I have reviewed the triage vital signs and the nursing notes.  Pertinent labs & imaging results that were available during my care of the patient were reviewed by me and considered in my medical decision making (see chart for details).     Patient returns for check of cellulitis, and recent incision and drainage}. The region appears to be poorly healed.  Moderate amount of cellulitic skin changes noted.   Patient symptoms improved from prior visit but infection not resolved.  Attempt to  perform I&D with minimal discharge.  Packing was placed.  Pt placed on IV clindamycin while in the ER.  She did receive a dose of morphine but subsequently developed urticarial hives.  Pt otherwise tolerates repacking and repeat I&D. Afebrile and hemodynamically stable. Pt is instructed to continue with home care or antibiotics. Pt has a good understanding of return precautions and is safe for discharge at this time. She will need to return in 48 hrs for wound recheck.     Final Clinical Impressions(s) / ED Diagnoses   Final diagnoses:  Visit for wound  check  Cellulitis of left lower extremity    New Prescriptions Current Discharge Medication List     I personally performed the services described in this documentation, which was scribed in my presence. The recorded information has been reviewed and is accurate.       [name], PA-C 03/23/17 1953    Maia Plan, MD 03/24/17 [phone]

## 2017-03-23 NOTE — Discharge Instructions (Signed)
Please continue to apply warm compress several times daily to help aid with healing.  Take antibiotic with food for the full duration.  Take Ultram/Tramadol as needed for pain.  Return in 48 hrs for wound recheck.  Return sooner if your condition worsen or if you have other concerns.

## 2017-03-23 NOTE — ED Triage Notes (Signed)
Pt to ED from home for wound recheck - had abscess drained and packed 2 days ago and states was told to come back. Reports pain is the same, didn't receive her discharge papers because she thought she was all ready to leave. Pt reports the swelling in her ankle is the same, no other or new signs of infection noted, pt denies fevers/chills since. Ambulatory.

## 2017-07-24 ENCOUNTER — Encounter (HOSPITAL_BASED_OUTPATIENT_CLINIC_OR_DEPARTMENT_OTHER): Payer: Self-pay | Admitting: *Deleted

## 2017-07-24 ENCOUNTER — Emergency Department (HOSPITAL_BASED_OUTPATIENT_CLINIC_OR_DEPARTMENT_OTHER): Payer: Self-pay

## 2017-07-24 ENCOUNTER — Emergency Department (HOSPITAL_BASED_OUTPATIENT_CLINIC_OR_DEPARTMENT_OTHER)
Admission: EM | Admit: 2017-07-24 | Discharge: 2017-07-24 | Disposition: A | Discharge status: Home / Self Care | Payer: Self-pay | Attending: Emergency Medicine | Admitting: Emergency Medicine

## 2017-07-24 DIAGNOSIS — L03213 Periorbital cellulitis: Secondary | ICD-10-CM | POA: Insufficient documentation

## 2017-07-24 DIAGNOSIS — F17210 Nicotine dependence, cigarettes, uncomplicated: Secondary | ICD-10-CM | POA: Insufficient documentation

## 2017-07-24 DIAGNOSIS — H00011 Hordeolum externum right upper eyelid: Secondary | ICD-10-CM | POA: Insufficient documentation

## 2017-07-24 MED ORDER — CLINDAMYCIN HCL 150 MG PO CAPS: 300.0000 mg | ORAL_CAPSULE | Freq: Three times a day (TID) | ORAL | 0 refills | Status: AC

## 2017-07-24 MED ORDER — AMOXICILLIN-POT CLAVULANATE 875-125 MG PO TABS
1.0000 | ORAL_TABLET | Freq: Two times a day (BID) | ORAL | Status: DC
  Administered 2017-07-24: 1 via ORAL
  Filled 2017-07-24: qty 1

## 2017-07-24 MED ORDER — AMOXICILLIN-POT CLAVULANATE 875-125 MG PO TABS: 1.0000 | ORAL_TABLET | Freq: Two times a day (BID) | ORAL | 0 refills | Status: AC

## 2017-07-24 MED ORDER — CLINDAMYCIN HCL 150 MG PO CAPS
300.0000 mg | ORAL_CAPSULE | Freq: Once | ORAL | Status: AC
  Administered 2017-07-24: 300 mg via ORAL
  Filled 2017-07-24: qty 2

## 2017-07-24 NOTE — ED Triage Notes (Signed)
Swelling under her left eye x 3 days. Hx of same that was diagnosed as a skin infection.

## 2017-07-24 NOTE — Discharge Instructions (Signed)
Please take all of your antibiotics until finished!   You may develop abdominal discomfort or diarrhea from the antibiotic.  You may help offset this with probiotics which you can buy or get in yogurt. Do not eat  or take the probiotics until 2 hours after your antibiotic.   Alternate 600 mg of ibuprofen and [phone] mg of Tylenol every 3 hours as needed for pain. Do not exceed 4000 mg of Tylenol daily. Continue to apply cool compresses to the eyes. Follow up with primary care or ophthalmologist for reevaluation. Return to the ED if any concerning signs or symptoms develop such as worsening redness, swelling, fevers, or vision changes.

## 2017-07-24 NOTE — ED Notes (Signed)
Patient normally wear contacts but doesn't have them in for visual acuity. Patient does not have glasses either with her.

## 2017-07-24 NOTE — ED Provider Notes (Signed)
[facility]   CSN: [id number] Arrival date & time: 07/24/17  1935     History   Chief Complaint Chief Complaint  Patient presents with  . Cellulitis    HPI Carolyn Frey is a 36 y.o. female with history of polysubstance abuse who presents today with chief complaint acute onset, progressively worsening left eyelid erythema, edema, and pain. Symptoms began 3 days ago, pain is constant and sharp and throbbing in nature. Pain worsens with movement of the eye in the upward region or raising her eyebrows. Patient states that around 3 days ago she "popped a zit" near the left eyebrow. She endorses blurry vision of the eye which she thinks is related to eyelid swelling. Denies foreign body sensation, excessive tearing or drainage. States both eyes are itchy. Yesterday she noted a tender erythematous lesion to the upper inner right eyelid. She has tried compresses for symptoms of both eyes, without significant relief. She states the last time she had similar symptoms she had a periorbital cellulitis. Denies fever, chills, headaches, nasal congestion, CP, SOB, nausea, vomiting, or abdominal pain. She does wear contacts and glasses, is currently wearing neither.  The history is provided by the patient.    Past Medical History:  Diagnosis Date  . Substance abuse [facility])     Patient Active Problem List   Diagnosis Date Noted  . Substance induced mood disorder (HCC) 06/19/2015  . Polysubstance abuse [facility])     Past Surgical History:  Procedure Laterality Date  . ELBOW FRACTURE SURGERY    . HERNIA REPAIR      OB History    Gravida Para Term Preterm AB Living   1             SAB TAB Ectopic Multiple Live Births                   Home Medications    Prior to Admission medications   Medication Sig Start Date End Date Taking? Authorizing Provider  amoxicillin-clavulanate (AUGMENTIN) 875-125 MG tablet Take 1 tablet by mouth 2 (two) times daily. 07/24/17 08/03/17   Michela Pitcher A, PA-C  clindamycin (CLEOCIN) 150 MG capsule Take 2 capsules (300 mg total) by mouth 3 (three) times daily. 07/24/17 08/03/17  Michela Pitcher A, PA-C  traMADol (ULTRAM) 50 MG tablet Take 1 tablet (50 mg total) by mouth every 6 (six) hours as needed. 03/23/17   [name], PA-C    Family History No family history on file.  Social History Social History  Substance Use Topics  . Smoking status: Current Every Day Smoker    Packs/day: 0.50    Types: Cigarettes  . Smokeless tobacco: Never Used  . Alcohol use No     Allergies   Patient has no known allergies.   Review of Systems Review of Systems  Constitutional: Negative for chills and fever.  HENT: Negative for congestion and ear pain.   Eyes: Positive for pain, redness, itching and visual disturbance. Negative for photophobia and discharge.  Respiratory: Negative for cough and shortness of breath.   Cardiovascular: Negative for chest pain.  Gastrointestinal: Negative for abdominal pain, nausea and vomiting.  All other systems reviewed and are negative.    Physical Exam Updated Vital Signs BP 111/77   Pulse 99   Temp 98.5 F (36.9 C) (Oral)   Resp 16   Ht  (1.6 m)   Wt 54.4 kg (120 lb)   LMP 05/24/2017 (Approximate)   SpO2  100%   BMI 21.26 kg/m   Physical Exam  Constitutional: She appears well-developed and well-nourished. No distress.  HENT:  Head: Normocephalic and atraumatic.  Right Ear: External ear normal.  Left Ear: External ear normal.  Nose: Nose normal.  Mouth/Throat: Oropharynx is clear and moist.  Eyes: Pupils are equal, round, and reactive to light. Conjunctivae are normal. Right eye exhibits no discharge. Left eye exhibits no discharge. No scleral icterus.  Left eye with erythema and swelling of upper and lower lids. No chemosis, proptosis, consensual photophobia, or injected conjunctiva. No excessive tearing or drainage. She has pain with EOMs of the left eye in the upward direction  there is an erythematous indurated nodule towards the medial aspect of her left eyebrow which is maximally tender to palpation with surrounding tenderness. Otherwise no tenderness to palpation of the orbits or zygomatic arch. No foreign bodies noted. Right upper eyelid with hordeolum.  Neck: Normal range of motion. Neck supple. No JVD present. No tracheal deviation present.  Cardiovascular: Normal rate.   tachycardic  Pulmonary/Chest: Effort normal.  Abdominal: She exhibits no distension.  Musculoskeletal: She exhibits no edema.  Neurological: She is alert.  Skin: Skin is warm and dry. No erythema.  Psychiatric: She has a normal mood and affect. Her behavior is normal.  Nursing note and vitals reviewed.    ED Treatments / Results  Labs (all labs ordered are listed, but only abnormal results are displayed) Labs Reviewed - No data to display  EKG  EKG Interpretation None       Radiology Ct Orbitss W/o Cm  Result Date: 07/24/2017 CLINICAL DATA:  Acute left thigh swelling without known injury. EXAM: CT ORBITS WITHOUT CONTRAST TECHNIQUE: Multidetector CT images were obtained using the standard protocol without intravenous contrast. COMPARISON:  CT scan of October 23, 2016. FINDINGS: Orbits:  No postseptal abnormality is noted.  Globes appear normal. Visualized sinuses: Right maxillary sinusitis is noted. Soft tissues: Soft tissue thickening is seen overlying the left nasal bridge and left supraorbital rim concerning for inflammation or hematoma. Probable cellulitis seen involving the left pre maxillary tissues. Limited intracranial: No significant or unexpected finding. IMPRESSION: Soft tissue thickening seen overlying left nasal bridge and left supraorbital rim concerning for inflammation or hematoma. Right maxillary sinusitis. Probable subcutaneous cellulitis involving left premaxillary tissues. Electronically Signed   By: Lupita Raider, M.D.   On: 07/24/2017 21:11     Procedures Procedures (including critical care time)  Medications Ordered in ED Medications  clindamycin (CLEOCIN) capsule 300 mg (300 mg Oral Given 07/24/17 2149)     Initial Impression / Assessment and Plan / ED Course  I have reviewed the triage vital signs and the nursing notes.  Pertinent labs & imaging results that were available during my care of the patient were reviewed by me and considered in my medical decision making (see chart for details).     Patient with left upper and lower eyelid edema, erythema, and pain. Also has right upper eye hordeolum. Afebrile, vital signs are stable (initially tachycardic the patient states this is because she was nervous). She has had preseptal cellulitis in the past, and with pain with EOMs there is concern for periorbital cellulitis versus nerve entrapment. No evidence of HSV, corneal abrasion, conjunctivitis, or foreign body. CT of the orbits was obtained which showedSoft tissue thickening seen overlying left nasal bridge and left supraorbital rim concerning for inflammation or hematoma. Right maxillary sinusitis. Probable subcutaneous cellulitis involving left premaxillary tissues. Will discharge home  with clindamycin and Augmentin. First dose given in the ED. She will follow up with PCP or ophthalmologist in the next 3-4 days for reevaluation. Discussed indications for return to the ED. Pt verbalized understanding of and agreement with plan and is safe for discharge home at this time.  Final Clinical Impressions(s) / ED Diagnoses   Final diagnoses:  Periorbital cellulitis of left eye  Hordeolum externum of right upper eyelid    New Prescriptions Discharge Medication List as of 07/24/2017  9:37 PM    START taking these medications   Details  amoxicillin-clavulanate (AUGMENTIN) 875-125 MG tablet Take 1 tablet by mouth 2 (two) times daily., Starting Tue 07/24/2017, Until Fri 08/03/2017, Print         Luevenia Maxin, [place] A, PA-C 07/25/17  0123    Clarene Duke Ambrose Finland, MD 07/25/17 1322

## 2017-07-24 NOTE — ED Notes (Signed)
Patient transported to CT 

## 2017-07-24 NOTE — ED Notes (Signed)
Pt returned from CT °

## 2017-11-17 ENCOUNTER — Emergency Department (HOSPITAL_BASED_OUTPATIENT_CLINIC_OR_DEPARTMENT_OTHER)
Admission: EM | Admit: 2017-11-17 | Discharge: 2017-11-17 | Discharge status: Against Medical Advice | Payer: Self-pay | Attending: Emergency Medicine | Admitting: Emergency Medicine

## 2017-11-17 ENCOUNTER — Other Ambulatory Visit: Payer: Self-pay

## 2017-11-17 ENCOUNTER — Emergency Department (HOSPITAL_BASED_OUTPATIENT_CLINIC_OR_DEPARTMENT_OTHER): Payer: Self-pay

## 2017-11-17 ENCOUNTER — Encounter (HOSPITAL_BASED_OUTPATIENT_CLINIC_OR_DEPARTMENT_OTHER): Payer: Self-pay | Admitting: *Deleted

## 2017-11-17 DIAGNOSIS — R7401 Elevation of levels of liver transaminase levels: Secondary | ICD-10-CM

## 2017-11-17 DIAGNOSIS — R509 Fever, unspecified: Secondary | ICD-10-CM | POA: Insufficient documentation

## 2017-11-17 DIAGNOSIS — R531 Weakness: Secondary | ICD-10-CM | POA: Insufficient documentation

## 2017-11-17 DIAGNOSIS — A419 Sepsis, unspecified organism: Secondary | ICD-10-CM

## 2017-11-17 DIAGNOSIS — F17210 Nicotine dependence, cigarettes, uncomplicated: Secondary | ICD-10-CM | POA: Insufficient documentation

## 2017-11-17 DIAGNOSIS — R740 Nonspecific elevation of levels of transaminase and lactic acid dehydrogenase [LDH]: Secondary | ICD-10-CM | POA: Insufficient documentation

## 2017-11-17 DIAGNOSIS — F1110 Opioid abuse, uncomplicated: Secondary | ICD-10-CM | POA: Insufficient documentation

## 2017-11-17 DIAGNOSIS — R110 Nausea: Secondary | ICD-10-CM | POA: Insufficient documentation

## 2017-11-17 LAB — RAPID URINE DRUG SCREEN, HOSP PERFORMED
Amphetamines: POSITIVE — AB
BENZODIAZEPINES: POSITIVE — AB
Barbiturates: NOT DETECTED
COCAINE: NOT DETECTED
Opiates: POSITIVE — AB
Tetrahydrocannabinol: NOT DETECTED

## 2017-11-17 LAB — CBC WITH DIFFERENTIAL/PLATELET
BASOS PCT: 0 %
Basophils Absolute: 0 10*3/uL (ref 0.0–0.1)
EOS ABS: 0 10*3/uL (ref 0.0–0.7)
Eosinophils Relative: 0 %
HCT: 32.7 % — ABNORMAL LOW (ref 36.0–46.0)
Hemoglobin: 10.7 g/dL — ABNORMAL LOW (ref 12.0–15.0)
Lymphocytes Relative: 5 %
Lymphs Abs: 0.4 10*3/uL — ABNORMAL LOW (ref 0.7–4.0)
MCH: 26.6 pg (ref 26.0–34.0)
MCHC: 32.7 g/dL (ref 30.0–36.0)
MCV: 81.1 fL (ref 78.0–100.0)
MONO ABS: 0.5 10*3/uL (ref 0.1–1.0)
MONOS PCT: 6 %
Neutro Abs: 7 10*3/uL (ref 1.7–7.7)
Neutrophils Relative %: 89 %
Platelets: 215 10*3/uL (ref 150–400)
RBC: 4.03 MIL/uL (ref 3.87–5.11)
RDW: 14.2 % (ref 11.5–15.5)
WBC: 7.9 10*3/uL (ref 4.0–10.5)

## 2017-11-17 LAB — COMPREHENSIVE METABOLIC PANEL
ALBUMIN: 3 g/dL — AB (ref 3.5–5.0)
ALK PHOS: 151 U/L — AB (ref 38–126)
ALT: 83 U/L — AB (ref 14–54)
AST: 116 U/L — AB (ref 15–41)
Anion gap: 10 (ref 5–15)
BUN: 9 mg/dL (ref 6–20)
CALCIUM: 8.7 mg/dL — AB (ref 8.9–10.3)
CO2: 23 mmol/L (ref 22–32)
CREATININE: 0.95 mg/dL (ref 0.44–1.00)
Chloride: 97 mmol/L — ABNORMAL LOW (ref 101–111)
GFR calc Af Amer: >60 mL/min (ref >60)
GFR calc non Af Amer: >60 mL/min (ref >60)
GLUCOSE: 134 mg/dL — AB (ref 65–99)
Potassium: 3.8 mmol/L (ref 3.5–5.1)
SODIUM: 130 mmol/L — AB (ref 135–145)
Total Bilirubin: 0.5 mg/dL (ref 0.3–1.2)
Total Protein: 7.1 g/dL (ref 6.5–8.1)

## 2017-11-17 LAB — I-STAT CG4 LACTIC ACID, ED
Lactic Acid, Venous: 2.71 mmol/L (ref 0.5–1.9)
Lactic Acid, Venous: 2.1 mmol/L (ref 0.5–1.9)

## 2017-11-17 LAB — URINALYSIS, ROUTINE W REFLEX MICROSCOPIC
BILIRUBIN URINE: NEGATIVE
Glucose, UA: 100 mg/dL — AB
KETONES UR: NEGATIVE mg/dL
Leukocytes, UA: NEGATIVE
NITRITE: NEGATIVE
PH: 6.5 (ref 5.0–8.0)
Protein, ur: 30 mg/dL — AB
Specific Gravity, Urine: <1.005 — ABNORMAL LOW (ref 1.005–1.030)

## 2017-11-17 LAB — PREGNANCY, URINE: PREG TEST UR: NEGATIVE

## 2017-11-17 LAB — URINALYSIS, MICROSCOPIC (REFLEX)

## 2017-11-17 LAB — ACETAMINOPHEN LEVEL

## 2017-11-17 LAB — RAPID STREP SCREEN (MED CTR MEBANE ONLY): Streptococcus, Group A Screen (Direct): NEGATIVE

## 2017-11-17 MED ORDER — OSELTAMIVIR PHOSPHATE 75 MG PO CAPS: 75.0000 mg | ORAL_CAPSULE | Freq: Two times a day (BID) | ORAL | 0 refills | Status: AC

## 2017-11-17 MED ORDER — SODIUM CHLORIDE 0.9 % IV BOLUS (SEPSIS)
250.0000 mL | Freq: Once | INTRAVENOUS | Status: AC
  Administered 2017-11-17: 250 mL via INTRAVENOUS

## 2017-11-17 MED ORDER — SODIUM CHLORIDE 0.9 % IV BOLUS (SEPSIS)
1000.0000 mL | Freq: Once | INTRAVENOUS | Status: AC
  Administered 2017-11-17: 1000 mL via INTRAVENOUS

## 2017-11-17 MED ORDER — VANCOMYCIN HCL IN DEXTROSE 1-5 GM/200ML-% IV SOLN
1000.0000 mg | Freq: Once | INTRAVENOUS | Status: AC
  Administered 2017-11-17: 1000 mg via INTRAVENOUS
  Filled 2017-11-17: qty 200

## 2017-11-17 MED ORDER — SODIUM CHLORIDE 0.9 % IV BOLUS (SEPSIS)
500.0000 mL | Freq: Once | INTRAVENOUS | Status: AC
  Administered 2017-11-17: 500 mL via INTRAVENOUS

## 2017-11-17 MED ORDER — PIPERACILLIN-TAZOBACTAM 3.375 G IVPB 30 MIN
3.3750 g | Freq: Once | INTRAVENOUS | Status: AC
  Administered 2017-11-17: 3.375 g via INTRAVENOUS
  Filled 2017-11-17 (×2): qty 50

## 2017-11-17 MED ORDER — IBUPROFEN 200 MG PO TABS
600.0000 mg | ORAL_TABLET | Freq: Once | ORAL | Status: AC
  Administered 2017-11-17: 17:00:00 600 mg via ORAL
  Filled 2017-11-17: qty 1

## 2017-11-17 NOTE — Progress Notes (Signed)
A consult was received from an ED physician for vancomycin and zosyn per pharmacy dosing.  The patient's profile has been reviewed for ht/wt/allergies/indication/available labs.   A one time order has been placed for vancomycin and zosyn.  Further antibiotics/pharmacy consults should be ordered by admitting physician if indicated.                       Thank you, Fredrik RiggerMarkle, Alara Daniel Sue 11/17/2017  5:49 PM

## 2017-11-17 NOTE — ED Triage Notes (Signed)
Pt reports fever, body aches, sore throat since yesterday. Pt admits to using heroin, last use yesterday. States she thought she was withdrawing so took 1/4 suboxone strip and a klonopin. Took tylenol- cold combination med pta

## 2017-11-17 NOTE — Discharge Instructions (Signed)
You have left against medical advice.  Please return if your condition worsen.

## 2017-11-17 NOTE — ED Provider Notes (Signed)
[facility]ERG[facility] DEPARTMENT Provider Note   CSN: [id number] Arrival date & time: 11/17/17  1614     History   Chief Complaint Chief Complaint  Patient presents with  . Fever    HPI Carolyn Frey is a 37 y.o. female.  HPI   37 year old female with a significant history of polysubstance abuse including heroin abuse presenting complaining of fever.  Patient report for the past 2 days she has been having fever, chills, body aches, headache, runny nose, sore throat, and decrease in appetite.  Symptoms persistent, patient feels weak and tired.  She does admits to using heroin yesterday.  Admits to using IV drugs.  She endorsed mild nausea without vomiting.  Did report some mild loose stools.  Denies chest pain or shortness of breath.  Denies any significant abdominal pain or dysuria.  She did not remember last menstrual period due to be having irregular period.  She did not have her flu shot this year.  Denies any known drug allergy.  Past Medical History:  Diagnosis Date  . Substance abuse [facility])     Patient Active Problem List   Diagnosis Date Noted  . Substance induced mood disorder ([place]) 06/19/2015  . Polysubstance abuse [facility])     Past Surgical History:  Procedure Laterality Date  . ELBOW FRACTURE SURGERY    . HERNIA REPAIR      OB History    Gravida Para Term Preterm AB Living   1             SAB TAB Ectopic Multiple Live Births                   Home Medications    Prior to Admission medications   Medication Sig Start Date End Date Taking? Authorizing Provider  traMADol (ULTRAM) 50 MG tablet Take 1 tablet (50 mg total) by mouth every 6 (six) hours as needed. 03/23/17   Domenic Moras, PA-C    Family History No family history on file.  Social History Social History   Tobacco Use  . Smoking status: Current Every Day Smoker    Packs/day: 0.50    Types: Cigarettes  . Smokeless tobacco: Never Used  Substance Use Topics  . Alcohol use: No  .  Drug use: Yes    Frequency: 7.0 times per week    Types: IV    Comment: heroin     Allergies   Patient has no known allergies.   Review of Systems Review of Systems  All other systems reviewed and are negative.    Physical Exam Updated Vital Signs BP 105/67 (BP Location: Left Arm)   Pulse (!) 142   Temp (!) 102.5 F (39.2 C) (Oral)   Resp 18   Ht '5\' 4"'$  (1.626 m)   Wt 54.4 kg (120 lb)   SpO2 98%   BMI 20.60 kg/m   Physical Exam  Constitutional: She appears well-developed and well-nourished. No distress.  Patient appears uncomfortable but nontoxic  HENT:  Head: Atraumatic.  Ears: TMs mildly bulging but not erythematous Nose: Rhinorrhea Throat: Uvula is midline, no tonsillar enlargement but evidence of post oropharyngeal erythema, no trismus  Eyes: Conjunctivae and EOM are normal. Pupils are equal, round, and reactive to light.  Neck: Normal range of motion. Neck supple.  No nuchal rigidity  Cardiovascular:  Tachycardia without murmur rubs or gallops  Pulmonary/Chest: Effort normal and breath sounds normal. She has no wheezes. She has no rales.  Abdominal: Soft. She  exhibits no distension. There is no tenderness.  Musculoskeletal: She exhibits tenderness (Generalized tenderness to palpation of the skin without any focal point tenderness.).  Lymphadenopathy:    She has cervical adenopathy.  Neurological: She is alert.  Skin: Skin is warm. No rash noted.  Multiple track marks noted on right forearm without evidence of infection.  Radial pulse 2+.  Psychiatric: She has a normal mood and affect.  Nursing note and vitals reviewed.    ED Treatments / Results  Labs (all labs ordered are listed, but only abnormal results are displayed) Labs Reviewed  COMPREHENSIVE METABOLIC PANEL - Abnormal; Notable for the following components:      Result Value   Sodium 130 (*)    Chloride 97 (*)    Glucose, Bld 134 (*)    Calcium 8.7 (*)    Albumin 3.0 (*)    AST 116 (*)     ALT 83 (*)    Alkaline Phosphatase 151 (*)    All other components within normal limits  CBC WITH DIFFERENTIAL/PLATELET - Abnormal; Notable for the following components:   Hemoglobin 10.7 (*)    HCT 32.7 (*)    Lymphs Abs 0.4 (*)    All other components within normal limits  URINALYSIS, ROUTINE W REFLEX MICROSCOPIC - Abnormal; Notable for the following components:   Specific Gravity, Urine <1.005 (*)    Glucose, UA 100 (*)    Hgb urine dipstick SMALL (*)    Protein, ur 30 (*)    All other components within normal limits  ACETAMINOPHEN LEVEL - Abnormal; Notable for the following components:   Acetaminophen (Tylenol), Serum <10 (*)    All other components within normal limits  RAPID URINE DRUG SCREEN, HOSP PERFORMED - Abnormal; Notable for the following components:   Opiates POSITIVE (*)    Benzodiazepines POSITIVE (*)    Amphetamines POSITIVE (*)    All other components within normal limits  URINALYSIS, MICROSCOPIC (REFLEX) - Abnormal; Notable for the following components:   Bacteria, UA RARE (*)    Squamous Epithelial / LPF 0-5 (*)    All other components within normal limits  I-STAT CG4 LACTIC ACID, ED - Abnormal; Notable for the following components:   Lactic Acid, Venous 2.71 (*)    All other components within normal limits  I-STAT CG4 LACTIC ACID, ED - Abnormal; Notable for the following components:   Lactic Acid, Venous 2.10 (*)    All other components within normal limits  RAPID STREP SCREEN (NOT AT [facility])  CULTURE, GROUP A STREP ([place])  CULTURE, BLOOD (ROUTINE X 2)  CULTURE, BLOOD (ROUTINE X 2)  RESPIRATORY PANEL BY PCR  PREGNANCY, URINE    EKG  EKG Interpretation  Date/Time:  Saturday November 17 2017 18:00:41 EST Ventricular Rate:  104 PR Interval:    QRS Duration: 91 QT Interval:  339 QTC Calculation: 446 R Axis:   95 Text Interpretation:  Sinus tachycardia Borderline right axis deviation since last tracing no significant change Confirmed by Malvin Johns  [phone]) on 11/17/2017 6:48:06 PM       Radiology Dg Chest 2 View  Result Date: 11/17/2017 CLINICAL DATA:  Back pain. Abdominal pain, cough with fever. Confirmed IV drug use. Sepsis EXAM: CHEST  2 VIEW COMPARISON:  None. FINDINGS: The heart size and mediastinal contours are within normal limits. Both lungs are clear. The visualized skeletal structures are unremarkable. IMPRESSION: No active cardiopulmonary disease. Electronically Signed   By: Staci Righter M.D.   On: 11/17/2017 19:03  Procedures Procedures (including critical care time)  Medications Ordered in ED Medications  ibuprofen (ADVIL,MOTRIN) tablet 600 mg (600 mg Oral Given 11/17/17 1654)  sodium chloride 0.9 % bolus 1,000 mL (0 mLs Intravenous Stopped 11/17/17 1929)    And  sodium chloride 0.9 % bolus 500 mL (0 mLs Intravenous Stopped 11/17/17 1856)    And  sodium chloride 0.9 % bolus 250 mL (0 mLs Intravenous Stopped 11/17/17 1929)  piperacillin-tazobactam (ZOSYN) IVPB 3.375 g (0 g Intravenous Stopped 11/17/17 1857)  vancomycin (VANCOCIN) IVPB 1000 mg/200 mL premix (0 mg Intravenous Stopped 11/17/17 1929)     Initial Impression / Assessment and Plan / ED Course  I have reviewed the triage vital signs and the nursing notes.  Pertinent labs & imaging results that were available during my care of the patient were reviewed by me and considered in my medical decision making (see chart for details).     BP (!) 93/50   Pulse (!) 105   Temp 98.5 F (36.9 C) (Oral)   Resp 17   Ht '5\' 4"'$  (1.626 m)   Wt 54.4 kg (120 lb)   SpO2 99%   BMI 20.60 kg/m    Final Clinical Impressions(s) / ED Diagnoses   Final diagnoses:  Febrile illness  Transaminitis    ED Discharge Orders        Ordered    oseltamivir (TAMIFLU) 75 MG capsule  Every 12 hours     11/17/17 2123     5:44 PM Patient here with flulike symptoms.  She has a fever of 102.5 and is tachycardic with a heart rate of 142.  No hypoxia.  Initial rapid strep test is negative.   Code sepsis initiated.  Will provide broad-spectrum antibiotic, and will fluid resuscitate at 30 mL/kg.  Ibuprofen given for fever. Care discussed with Dr. Tamera Punt.  9:24 PM Patient felt better after receiving IV fluid.  Fever resolved.  Lactic acid improved.  Her white count is normal.  Chest x-ray and urinalysis without signs of infection.  Influenza testing was obtained but has not resulted yet.  Evidence of transaminitis with AST 116, ALT 83, alk phos 151. Concern for hepatitis C due to IVDU. Patient received IV fluid at 30 mL/kg however she still remains tachycardic and hypotensive.  I recommend admission for further evaluation and management of her sepsis and transaminitis however at this time patient request to be discharged Uncertain.  She understands that her condition can worsen and she can potentially die.  Patient acknowledged risk and understand that she can return if her sxs worsen.  Will prescribe tamiflu.   CRITICAL CARE Performed by: Domenic Moras Total critical care time: 35 minutes Critical care time was exclusive of separately billable procedures and treating other patients. Critical care was necessary to treat or prevent imminent or life-threatening deterioration. Critical care was time spent personally by me on the following activities: development of treatment plan with patient and/or surrogate as well as nursing, discussions with consultants, evaluation of patient's response to treatment, examination of patient, obtaining history from patient or surrogate, ordering and performing treatments and interventions, ordering and review of laboratory studies, ordering and review of radiographic studies, pulse oximetry and re-evaluation of patient's condition.    Domenic Moras, PA-C 11/17/17 2135    Domenic Moras, PA-C 11/17/17 2137    Malvin Johns, MD 11/17/17 2312

## 2017-11-17 NOTE — ED Notes (Signed)
RN spoke with patient and explained benefits of staying and rsiks of leaving AMA. PT chose to leave AMA. RN and PA assured that pt had a narcan kit at home.

## 2017-11-17 NOTE — ED Notes (Signed)
Pt on cardiac monitor.

## 2017-11-17 NOTE — ED Notes (Signed)
RN aware of abnormal VS

## 2017-11-17 NOTE — ED Notes (Signed)
PT taking POs and tolerating well.

## 2017-11-17 NOTE — ED Notes (Signed)
Patient transported to X-ray 

## 2017-11-18 ENCOUNTER — Telehealth (HOSPITAL_BASED_OUTPATIENT_CLINIC_OR_DEPARTMENT_OTHER): Payer: Self-pay | Admitting: Emergency Medicine

## 2017-11-18 ENCOUNTER — Telehealth: Payer: Self-pay | Admitting: Infectious Diseases

## 2017-11-18 ENCOUNTER — Telehealth (HOSPITAL_BASED_OUTPATIENT_CLINIC_OR_DEPARTMENT_OTHER): Payer: Self-pay | Admitting: *Deleted

## 2017-11-18 LAB — RESPIRATORY PANEL BY PCR
Adenovirus: NOT DETECTED
BORDETELLA PERTUSSIS-RVPCR: NOT DETECTED
CORONAVIRUS 229E-RVPPCR: NOT DETECTED
CORONAVIRUS HKU1-RVPPCR: NOT DETECTED
Chlamydophila pneumoniae: NOT DETECTED
Coronavirus NL63: NOT DETECTED
Coronavirus OC43: NOT DETECTED
Influenza A: NOT DETECTED
Influenza B: NOT DETECTED
METAPNEUMOVIRUS-RVPPCR: NOT DETECTED
MYCOPLASMA PNEUMONIAE-RVPPCR: NOT DETECTED
PARAINFLUENZA VIRUS 1-RVPPCR: NOT DETECTED
PARAINFLUENZA VIRUS 2-RVPPCR: NOT DETECTED
Parainfluenza Virus 3: NOT DETECTED
Parainfluenza Virus 4: NOT DETECTED
Respiratory Syncytial Virus: NOT DETECTED
Rhinovirus / Enterovirus: NOT DETECTED

## 2017-11-18 LAB — BLOOD CULTURE ID PANEL (REFLEXED)
ACINETOBACTER BAUMANNII: NOT DETECTED
CANDIDA ALBICANS: NOT DETECTED
CANDIDA GLABRATA: NOT DETECTED
CANDIDA KRUSEI: NOT DETECTED
Candida parapsilosis: NOT DETECTED
Candida tropicalis: NOT DETECTED
ENTEROBACTER CLOACAE COMPLEX: NOT DETECTED
ENTEROBACTERIACEAE SPECIES: NOT DETECTED
ENTEROCOCCUS SPECIES: NOT DETECTED
ESCHERICHIA COLI: NOT DETECTED
Haemophilus influenzae: NOT DETECTED
Klebsiella oxytoca: NOT DETECTED
Klebsiella pneumoniae: NOT DETECTED
LISTERIA MONOCYTOGENES: NOT DETECTED
Methicillin resistance: NOT DETECTED
NEISSERIA MENINGITIDIS: NOT DETECTED
PROTEUS SPECIES: NOT DETECTED
PSEUDOMONAS AERUGINOSA: NOT DETECTED
STREPTOCOCCUS AGALACTIAE: NOT DETECTED
STREPTOCOCCUS PNEUMONIAE: NOT DETECTED
STREPTOCOCCUS PYOGENES: NOT DETECTED
STREPTOCOCCUS SPECIES: NOT DETECTED
Serratia marcescens: NOT DETECTED
Staphylococcus aureus (BCID): DETECTED — AB
Staphylococcus species: DETECTED — AB

## 2017-11-18 NOTE — Telephone Encounter (Signed)
[facility] called abnormal results of positive blood cultures. Both Cultures are positive for Staph Aureaous. Information given to ED provider

## 2017-11-18 NOTE — Telephone Encounter (Signed)
Call from Dr. Ninetta LightsHatcher with ID - called the patient with 2 different phone numbers - One that was provided when the patient was here yesterday and one from a previous encounter. The patients line were either not receiving phone calls, or disconnected. If patient calls back please have the patient report to an Emergency room to be admitted

## 2017-11-18 NOTE — Telephone Encounter (Signed)
Pt called dept in response to message left for her by charge nurse. Informed pt that her blood cultures from yesterday's ED visit were positive and she needed to return to ED for evaluation by MD and probable admission for antibiotics. Pt verbalized understanding but also asked if she could "come back tomorrow". Advised pt of increased risks of sepsis and/or death with delay in treatment. Advised pt to return to any ED as soon as possible. Pt verbalized understanding.

## 2017-11-18 NOTE — Telephone Encounter (Signed)
Called [facility] to let them know pt has positive bcx- I spoke with charge rn- they are trying to find pt.  She left AMA Called pt (twice) as well- no answer. "the person you are trying to reach is not accepting calls at this time."

## 2017-11-20 ENCOUNTER — Emergency Department (HOSPITAL_COMMUNITY): Admission: EM | Admit: 2017-11-20 | Discharge: 2017-11-20 | Discharge status: Absent without leave | Payer: Self-pay

## 2017-11-20 LAB — CULTURE, GROUP A STREP (THRC)

## 2017-11-20 LAB — CULTURE, BLOOD (ROUTINE X 2): SPECIAL REQUESTS: ADEQUATE

## 2017-11-21 ENCOUNTER — Encounter (HOSPITAL_COMMUNITY): Payer: Self-pay | Admitting: Emergency Medicine

## 2017-11-21 ENCOUNTER — Telehealth: Payer: Self-pay | Admitting: Emergency Medicine

## 2017-11-21 ENCOUNTER — Inpatient Hospital Stay (HOSPITAL_COMMUNITY)
Admission: EM | Admit: 2017-11-21 | Discharge: 2017-11-22 | DRG: 872 | Discharge status: Against Medical Advice | Payer: Self-pay | Attending: Internal Medicine | Admitting: Internal Medicine

## 2017-11-21 ENCOUNTER — Emergency Department (HOSPITAL_COMMUNITY): Payer: Self-pay

## 2017-11-21 DIAGNOSIS — R000 Tachycardia, unspecified: Secondary | ICD-10-CM | POA: Diagnosis present

## 2017-11-21 DIAGNOSIS — F17210 Nicotine dependence, cigarettes, uncomplicated: Secondary | ICD-10-CM | POA: Diagnosis present

## 2017-11-21 DIAGNOSIS — A4101 Sepsis due to Methicillin susceptible Staphylococcus aureus: Principal | ICD-10-CM | POA: Diagnosis present

## 2017-11-21 DIAGNOSIS — F1123 Opioid dependence with withdrawal: Secondary | ICD-10-CM | POA: Diagnosis present

## 2017-11-21 DIAGNOSIS — E871 Hypo-osmolality and hyponatremia: Secondary | ICD-10-CM | POA: Diagnosis present

## 2017-11-21 DIAGNOSIS — A419 Sepsis, unspecified organism: Secondary | ICD-10-CM

## 2017-11-21 DIAGNOSIS — B1920 Unspecified viral hepatitis C without hepatic coma: Secondary | ICD-10-CM | POA: Diagnosis present

## 2017-11-21 DIAGNOSIS — M25522 Pain in left elbow: Secondary | ICD-10-CM | POA: Diagnosis present

## 2017-11-21 DIAGNOSIS — R7881 Bacteremia: Secondary | ICD-10-CM

## 2017-11-21 DIAGNOSIS — F1920 Other psychoactive substance dependence, uncomplicated: Secondary | ICD-10-CM | POA: Diagnosis present

## 2017-11-21 DIAGNOSIS — E872 Acidosis: Secondary | ICD-10-CM | POA: Diagnosis present

## 2017-11-21 DIAGNOSIS — E876 Hypokalemia: Secondary | ICD-10-CM | POA: Diagnosis present

## 2017-11-21 DIAGNOSIS — R768 Other specified abnormal immunological findings in serum: Secondary | ICD-10-CM | POA: Diagnosis present

## 2017-11-21 DIAGNOSIS — B9561 Methicillin susceptible Staphylococcus aureus infection as the cause of diseases classified elsewhere: Secondary | ICD-10-CM | POA: Diagnosis present

## 2017-11-21 DIAGNOSIS — F1990 Other psychoactive substance use, unspecified, uncomplicated: Secondary | ICD-10-CM | POA: Diagnosis present

## 2017-11-21 DIAGNOSIS — D649 Anemia, unspecified: Secondary | ICD-10-CM | POA: Diagnosis present

## 2017-11-21 LAB — URINALYSIS, ROUTINE W REFLEX MICROSCOPIC
Bilirubin Urine: NEGATIVE
Glucose, UA: NEGATIVE mg/dL
Ketones, ur: NEGATIVE mg/dL
Leukocytes, UA: NEGATIVE
NITRITE: NEGATIVE
PH: 6 (ref 5.0–8.0)
Protein, ur: 30 mg/dL — AB
SPECIFIC GRAVITY, URINE: 1.012 (ref 1.005–1.030)

## 2017-11-21 LAB — LACTIC ACID, PLASMA: LACTIC ACID, VENOUS: 1.5 mmol/L (ref 0.5–1.9)

## 2017-11-21 LAB — COMPREHENSIVE METABOLIC PANEL
ALT: 29 U/L (ref 14–54)
AST: 32 U/L (ref 15–41)
Albumin: 2 g/dL — ABNORMAL LOW (ref 3.5–5.0)
Alkaline Phosphatase: 151 U/L — ABNORMAL HIGH (ref 38–126)
Anion gap: 6 (ref 5–15)
BUN: 11 mg/dL (ref 6–20)
CO2: 25 mmol/L (ref 22–32)
CREATININE: 0.94 mg/dL (ref 0.44–1.00)
Calcium: 7 mg/dL — ABNORMAL LOW (ref 8.9–10.3)
Chloride: 103 mmol/L (ref 101–111)
Glucose, Bld: 148 mg/dL — ABNORMAL HIGH (ref 65–99)
POTASSIUM: 3 mmol/L — AB (ref 3.5–5.1)
Sodium: 134 mmol/L — ABNORMAL LOW (ref 135–145)
TOTAL PROTEIN: 5.2 g/dL — AB (ref 6.5–8.1)
Total Bilirubin: 0.2 mg/dL — ABNORMAL LOW (ref 0.3–1.2)

## 2017-11-21 LAB — I-STAT CHEM 8, ED
BUN: 11 mg/dL (ref 6–20)
CHLORIDE: 95 mmol/L — AB (ref 101–111)
Calcium, Ion: 0.99 mmol/L — ABNORMAL LOW (ref 1.15–1.40)
Creatinine, Ser: 0.9 mg/dL (ref 0.44–1.00)
Glucose, Bld: 138 mg/dL — ABNORMAL HIGH (ref 65–99)
HEMATOCRIT: 37 % (ref 36.0–46.0)
Hemoglobin: 12.6 g/dL (ref 12.0–15.0)
Potassium: 4.2 mmol/L (ref 3.5–5.1)
SODIUM: 132 mmol/L — AB (ref 135–145)
TCO2: 27 mmol/L (ref 22–32)

## 2017-11-21 LAB — CBC WITH DIFFERENTIAL/PLATELET
BASOS PCT: 0 %
Basophils Absolute: 0 10*3/uL (ref 0.0–0.1)
EOS ABS: 0 10*3/uL (ref 0.0–0.7)
Eosinophils Relative: 0 %
HCT: 31.5 % — ABNORMAL LOW (ref 36.0–46.0)
HEMOGLOBIN: 10.3 g/dL — AB (ref 12.0–15.0)
LYMPHS ABS: 0.6 10*3/uL — AB (ref 0.7–4.0)
Lymphocytes Relative: 7 %
MCH: 25.9 pg — AB (ref 26.0–34.0)
MCHC: 32.7 g/dL (ref 30.0–36.0)
MCV: 79.1 fL (ref 78.0–100.0)
Monocytes Absolute: 0.2 10*3/uL (ref 0.1–1.0)
Monocytes Relative: 2 %
NEUTROS PCT: 91 %
Neutro Abs: 7.9 10*3/uL — ABNORMAL HIGH (ref 1.7–7.7)
Platelets: 179 10*3/uL (ref 150–400)
RBC: 3.98 MIL/uL (ref 3.87–5.11)
RDW: 15 % (ref 11.5–15.5)
WBC: 8.8 10*3/uL (ref 4.0–10.5)

## 2017-11-21 LAB — I-STAT CG4 LACTIC ACID, ED
LACTIC ACID, VENOUS: 3.49 mmol/L — AB (ref 0.5–1.9)
LACTIC ACID, VENOUS: 2 mmol/L — AB (ref 0.5–1.9)
LACTIC ACID, VENOUS: 1.58 mmol/L (ref 0.5–1.9)

## 2017-11-21 LAB — I-STAT BETA HCG BLOOD, ED (MC, WL, AP ONLY): I-stat hCG, quantitative: <5 m[IU]/mL (ref <5)

## 2017-11-21 LAB — MAGNESIUM: Magnesium: 1.4 mg/dL — ABNORMAL LOW (ref 1.7–2.4)

## 2017-11-21 LAB — INFLUENZA PANEL BY PCR (TYPE A & B)
INFLAPCR: NEGATIVE
Influenza B By PCR: NEGATIVE

## 2017-11-21 MED ORDER — HYDROMORPHONE HCL 1 MG/ML IJ SOLN: 0.5000 mg | Freq: Once | INTRAMUSCULAR | Status: DC

## 2017-11-21 MED ORDER — CEFAZOLIN SODIUM-DEXTROSE 2-4 GM/100ML-% IV SOLN
2.0000 g | Freq: Three times a day (TID) | INTRAVENOUS | Status: DC
  Administered 2017-11-21 – 2017-11-22 (×3): 2 g via INTRAVENOUS
  Filled 2017-11-21 (×7): qty 100

## 2017-11-21 MED ORDER — ACETAMINOPHEN 500 MG PO TABS
1000.0000 mg | ORAL_TABLET | Freq: Once | ORAL | Status: AC
  Administered 2017-11-21: 1000 mg via ORAL
  Filled 2017-11-21: qty 2

## 2017-11-21 MED ORDER — OSELTAMIVIR PHOSPHATE 75 MG PO CAPS
75.0000 mg | ORAL_CAPSULE | Freq: Once | ORAL | Status: AC
Start: 2017-11-21 — End: 2017-11-21
  Administered 2017-11-21: 75 mg via ORAL
  Filled 2017-11-21: qty 1

## 2017-11-21 MED ORDER — MAGNESIUM SULFATE 2 GM/50ML IV SOLN
2.0000 g | Freq: Once | INTRAVENOUS | Status: AC
  Administered 2017-11-21: 2 g via INTRAVENOUS
  Filled 2017-11-21: qty 50

## 2017-11-21 MED ORDER — POTASSIUM CHLORIDE IN NACL 20-0.9 MEQ/L-% IV SOLN
INTRAVENOUS | Status: AC
  Administered 2017-11-22: via INTRAVENOUS
  Filled 2017-11-21 (×2): qty 1000

## 2017-11-21 MED ORDER — ONDANSETRON HCL 4 MG/2ML IJ SOLN
4.0000 mg | Freq: Once | INTRAMUSCULAR | Status: AC
  Administered 2017-11-21: 4 mg via INTRAVENOUS
  Filled 2017-11-21: qty 2

## 2017-11-21 MED ORDER — DEXTROSE 5 % IV SOLN
2.0000 g | Freq: Once | INTRAVENOUS | Status: AC
  Administered 2017-11-21: 2 g via INTRAVENOUS
  Filled 2017-11-21: qty 2

## 2017-11-21 MED ORDER — HYDROCODONE-ACETAMINOPHEN 5-325 MG PO TABS
1.0000 | ORAL_TABLET | ORAL | Status: DC | PRN
  Administered 2017-11-21 – 2017-11-22 (×3): 1 via ORAL
  Filled 2017-11-21 (×3): qty 1

## 2017-11-21 MED ORDER — HYDROMORPHONE HCL 1 MG/ML IJ SOLN
0.5000 mg | Freq: Once | INTRAMUSCULAR | Status: AC
  Administered 2017-11-21: 0.5 mg via INTRAVENOUS
  Filled 2017-11-21: qty 1

## 2017-11-21 MED ORDER — SODIUM CHLORIDE 0.9 % IV BOLUS (SEPSIS)
1000.0000 mL | Freq: Once | INTRAVENOUS | Status: AC
  Administered 2017-11-21: 1000 mL via INTRAVENOUS

## 2017-11-21 MED ORDER — POTASSIUM CHLORIDE CRYS ER 20 MEQ PO TBCR
40.0000 meq | EXTENDED_RELEASE_TABLET | Freq: Once | ORAL | Status: AC
  Administered 2017-11-21: 40 meq via ORAL
  Filled 2017-11-21: qty 2

## 2017-11-21 MED ORDER — ONDANSETRON HCL 4 MG/2ML IJ SOLN: 4.0000 mg | Freq: Once | INTRAMUSCULAR | Status: DC

## 2017-11-21 MED ORDER — VANCOMYCIN HCL IN DEXTROSE 1-5 GM/200ML-% IV SOLN
1000.0000 mg | Freq: Once | INTRAVENOUS | Status: AC
  Administered 2017-11-21: 1000 mg via INTRAVENOUS
  Filled 2017-11-21: qty 200

## 2017-11-21 MED ORDER — LORAZEPAM 2 MG/ML IJ SOLN
1.0000 mg | Freq: Once | INTRAMUSCULAR | Status: AC
  Administered 2017-11-21: 1 mg via INTRAVENOUS
  Filled 2017-11-21: qty 1

## 2017-11-21 MED ORDER — PIPERACILLIN-TAZOBACTAM 3.375 G IVPB 30 MIN
3.3750 g | Freq: Once | INTRAVENOUS | Status: AC
  Administered 2017-11-21: 3.375 g via INTRAVENOUS
  Filled 2017-11-21: qty 50

## 2017-11-21 MED ORDER — SODIUM CHLORIDE 0.9 % IV BOLUS (SEPSIS)
2000.0000 mL | Freq: Once | INTRAVENOUS | Status: AC
Start: 2017-11-21 — End: 2017-11-21
  Administered 2017-11-21: 2000 mL via INTRAVENOUS

## 2017-11-21 NOTE — Telephone Encounter (Signed)
Post ED Visit - Positive Culture Follow-up  Culture report reviewed by antimicrobial stewardship pharmacist:  []  Enzo BiNathan Batchelder, Pharm.D. []  Celedonio MiyamotoJeremy Frens, Pharm.D., BCPS AQ-ID [x]  Garvin FilaMike Maccia, Pharm.D., BCPS []  Georgina PillionElizabeth Martin, Pharm.D., BCPS []  Mount SinaiMinh Pham, VermontPharm.D., BCPS, AAHIVP []  Estella HuskMichelle Turner, Pharm.D., BCPS, AAHIVP []  Lysle Pearlachel Rumbarger, PharmD, BCPS []  Blake DivineShannon Parkey, PharmD []  Pollyann SamplesAndy Johnston, PharmD, BCPS  Positive blood culture Patient has already been contacted and is currently in the ED  Berle MullMiller, [place] 11/21/2017, 2:03 PM

## 2017-11-21 NOTE — H&P (Addendum)
History and Physical    Carolyn Frey:[id number] DOB: 02-22-1981 DOA: 11/21/2017  PCP: Patient, No Pcp Per   Patient coming from: Home  Chief Complaint: Generalized body aches, Fever.  HPI: Carolyn Frey is a 37 y.o. female with medical history significant for IV drug abuse heroin, presented to the ED today with complaints of generalized body aches, feeling like her head would explode, fevers of 4 days duration.  Reports left elbow pain, new.  No new skin rash, denies shortness of breath, no chest pain no cough, no abdominal pain vomiting or diarrhea, dysuria or frequency. she denies alcohol intake, smokes half pack cigarettes per day, occasionally takes methamphetamine also, last IV drug use was this morning heroin.  Patient went to the ED at [facility] 11/17/17, blood work was drawn but patient left AMA.  Patient had a lactic acidosis and blood cultures grew MSSA.   ED Course: Pressure up to 102.1 in ED, tachycardia up to 174, bp soft-systolics 90s-100 ( 85/57, sats 85% - Spurious read per EDP).  Lactic acid is elevated at 3.49, WBC normal 8.8, hemoglobin low but at baseline 10.3.  Potassium low 3.  UA rare bacteria.  Flu negative.  Chest x-ray left elbow x-ray negative for acute abnormality.  Patient was started on IV vancomycin, IV Zosyn and Iv cefepime in ED also given Tamiflu.  Pt was given a total of 3L IVF. Hospitalist was called to admit for sepsis likely 2/2 bacteremia and possible endocarditis.  Review of Systems: As per HPI otherwise 10 point review of systems negative.   Past Medical History:  Diagnosis Date  . Substance abuse [facility])     Past Surgical History:  Procedure Laterality Date  . ELBOW FRACTURE SURGERY    . HERNIA REPAIR       reports that she has been smoking cigarettes.  She has been smoking about 0.50 packs per day. she has never used smokeless tobacco. She reports that she uses drugs. Drug: IV. Frequency: 7.00 times per week. She reports that  she does not drink alcohol.  No Known Allergies  Family history noncontributory.  Prior to Admission medications   Medication Sig Start Date End Date Taking? Authorizing Provider  acetaminophen (TYLENOL) 500 MG tablet Take 1,500 mg by mouth every 4 (four) hours as needed for headache.   Yes [provider]  oseltamivir (TAMIFLU) 75 MG capsule Take 1 capsule (75 mg total) by mouth every 12 (twelve) hours. 11/17/17  Yes [name], PA-C  traMADol (ULTRAM) 50 MG tablet Take 1 tablet (50 mg total) by mouth every 6 (six) hours as needed. Patient not taking: Reported on 11/21/2017 03/23/17   [name], PA-C    Physical Exam: Vitals:   11/21/17 1715 11/21/17 1742 11/21/17 1745 11/21/17 1824  BP: 95/61  (!) 85/57 (!) 100/48  Pulse: (!) 119  (!) 105 (!) 113  Resp: 20  (!) 22 (!) 24  Temp:  98.8 F (37.1 C)  98.6 F (37 C)  TempSrc:  Oral  Oral  SpO2: 97%  (!) 85% 97%  Weight:      Height:        Constitutional: NAD, calm, Mildly uncomfortable Vitals:   11/21/17 1715 11/21/17 1742 11/21/17 1745 11/21/17 1824  BP: 95/61  (!) 85/57 (!) 100/48  Pulse: (!) 119  (!) 105 (!) 113  Resp: 20  (!) 22 (!) 24  Temp:  98.8 F (37.1 C)  98.6 F (37 C)  TempSrc:  Oral  Oral  SpO2: 97%  (!) 85% 97%  Weight:      Height:       Eyes: PERRL, lids and conjunctivae normal ENMT: Mucous membranes are moist. Posterior pharynx clear of any exudate or lesions.  Neck: normal, supple, no masses, no thyromegaly Respiratory: clear to auscultation bilaterally, no wheezing, no crackles. Normal respiratory effort. No accessory muscle use.  Cardiovascular:  Now regular rate and rhythm, no murmurs appreciated. No extremity edema. 2+ pedal pulses. No carotid bruits.  Abdomen: no tenderness, no masses palpated. No hepatosplenomegaly. Bowel sounds positive.  Musculoskeletal: no clubbing / cyanosis. No joint deformity upper and lower extremities. Good ROM, no contractures. Normal muscle tone. Left elbow pain  with movement but no swelling or redness appreciated.  Skin: no rashes, lesions, ulcers. No induration Neurologic: CN 2-12 grossly intact. Strength 5/5 in all 4.  Psychiatric: Normal judgment and insight. Alert and oriented x 3. Normal mood.   Labs on Admission: I have personally reviewed following labs and imaging studies  CBC: Recent Labs  Lab 11/17/17 1735 11/21/17 1450 11/21/17 1528  WBC 7.9 8.8  --   NEUTROABS 7.0 7.9*  --   HGB 10.7* 10.3* 12.6  HCT 32.7* 31.5* 37.0  MCV 81.1 79.1  --   PLT 215 179  --    Basic Metabolic Panel: Recent Labs  Lab 11/17/17 1735 11/21/17 1528 11/21/17 1622  NA 130* 132* 134*  K 3.8 4.2 3.0*  CL 97* 95* 103  CO2 23  --  25  GLUCOSE 134* 138* 148*  BUN 9 11 11   CREATININE 0.95 0.90 0.94  CALCIUM 8.7*  --  7.0*   Liver Function Tests: Recent Labs  Lab 11/17/17 1735 11/21/17 1622  AST 116* 32  ALT 83* 29  ALKPHOS 151* 151*  BILITOT 0.5 0.2*  PROT 7.1 5.2*  ALBUMIN 3.0* 2.0*   Urine analysis:    Component Value Date/Time   COLORURINE YELLOW 11/21/2017 1546   APPEARANCEUR CLEAR 11/21/2017 1546   LABSPEC 1.012 11/21/2017 1546   PHURINE 6.0 11/21/2017 1546   GLUCOSEU NEGATIVE 11/21/2017 1546   HGBUR SMALL (A) 11/21/2017 1546   BILIRUBINUR NEGATIVE 11/21/2017 1546   KETONESUR NEGATIVE 11/21/2017 1546   PROTEINUR 30 (A) 11/21/2017 1546   UROBILINOGEN 0.2 10/13/2010 0950   NITRITE NEGATIVE 11/21/2017 1546   LEUKOCYTESUR NEGATIVE 11/21/2017 1546    Radiological Exams on Admission: Dg Chest 2 View  Result Date: 11/21/2017 CLINICAL DATA:  Palpitations and fever. Last heroin use this morning. EXAM: CHEST  2 VIEW COMPARISON:  November 17, 2017 FINDINGS: The heart size and mediastinal contours are within normal limits. Both lungs are clear. The visualized skeletal structures are unremarkable. IMPRESSION: No active cardiopulmonary disease. Electronically Signed   By: Gerome Samavid  Williams III M.D   On: 11/21/2017 17:12   Dg Elbow Complete  Left  Result Date: 11/21/2017 CLINICAL DATA:  Intravenous drug use.  Sepsis.  Pain. EXAM: LEFT ELBOW - COMPLETE 3+ VIEW COMPARISON:  None. FINDINGS: Artifact related to overlying IV. Previous ORIF of an olecranon fracture appears unremarkable. Small osteochondroma the anterior distal humerus, not likely of clinical relevance. No sign of osteomyelitis, joint effusion or gas in the soft tissues. IMPRESSION: No acute finding.  See above discussion. Electronically Signed   By: Paulina FusiMark  Shogry M.D.   On: 11/21/2017 17:13    EKG: Sinus tachycardia.  Assessment/Plan Active Problems:   Bacteremia   Heroin abuse (HCC) IV drug   Bacteremia- fever- 103, body aches.  Meet Sepsis criteria. Blood cultures 11/17/17 from [facility]- MSSA.  IV drug abuse. UA- rare bact, chest xray- neg. IV vancomycin Zosyn and cefepime given in ED. -as results of blood cultures and sensitivities already known and no additional focus of infection at this time will start IV cefazolin for MSSA -ID consult a.m. -Echocardiogram -Follow-up blood cultures urine cultures drawn in ED - CBc a.m - N/s +20KCl 100cc/hr X 12 hrs  Heroin Abuse-last IV drug use this a.m.  -Patient extensively counseled on prognosis and why she needs to stay to complete her treatments and the severity of her illness -Norco 5/325 1 tablet every 4 hourly as needed  HypoKalemia- 3.  - Check Mag- 1.4 - replete electrolytes - BMP, MAg a.m   DVT prophylaxis:  Lovenox Code Status: Full  Family Communication:None at bedside Disposition Plan: To be determined  Consults called: None Admission status:  Inpt,stepdown with soft Bp   Onnie Boer MD [facility] Pager [phone]  If 6PM-2AM, please contact night-coverage [email or web address] Password [email or web address]  11/21/2017, 7:50 PM

## 2017-11-21 NOTE — ED Provider Notes (Signed)
Planes of diffuse body aches and headache onset this morning.  Last injected herself with heroin to her right arm 5 AM today.  Denies cough.  No nausea or vomiting no abdominal pain.  On exam alert, Glasgow Coma Score 15, moderately ill-appearing HEENT exam oropharynx normal.  Neck is supple no lymphadenopathy heart tachycardic regular rhythm no murmurs lungs clear to auscultation abdomen nondistended nontender right upper extremity with fresh needle marks at forearm.  No surrounding redness or tenderness.  Neurovascular intact.  All other extremities or contusion abrasion or tenderness neurovascular intact skin warm dry no rash   Carolyn SouJacubowitz, Carolyn Storrs, MD 11/21/17 1711

## 2017-11-21 NOTE — ED Notes (Signed)
Bed: WA02 Expected date:  Expected time:  Means of arrival:  Comments: [facility] 

## 2017-11-21 NOTE — ED Provider Notes (Signed)
[facility]-EMERGENCY DEPT Provider Note   CSN: [id number] Arrival date & time: 11/21/17  1328     History   Chief Complaint Chief Complaint  Patient presents with  . Tachycardia  . Fever    HPI   Blood pressure 117/67, pulse (!) 163, temperature (!) 103.1 F (39.5 C), temperature source Oral, resp. rate 20, height 5\' 4"  (1.626 m), weight 54.4 kg (120 lb), SpO2 94 %, unknown if currently breastfeeding.  Carolyn Frey is a 37 y.o. female with past medical history significant for active IV heroin use complaining of fevers, dehydration, myalgia, rhinorrhea, sore throat onset approximately 5 days ago.  Patient poor historian, high fever and variable on her timeline reporting.  Chart review shows that this patient was seen at [facility], she was septic at that time they recommended admission however she left AGAINST MEDICAL ADVICE, she was given a prescription for Tamiflu but she states that her boyfriend has been hiding it and she has not been taking it regularly.  Past Medical History:  Diagnosis Date  . Substance abuse [facility])     Patient Active Problem List   Diagnosis Date Noted  . Substance induced mood disorder (HCC) 06/19/2015  . Polysubstance abuse [facility])     Past Surgical History:  Procedure Laterality Date  . ELBOW FRACTURE SURGERY    . HERNIA REPAIR      OB History    Gravida Para Term Preterm AB Living   1             SAB TAB Ectopic Multiple Live Births                   Home Medications    Prior to Admission medications   Medication Sig Start Date End Date Taking? Authorizing Provider  acetaminophen (TYLENOL) 500 MG tablet Take 1,500 mg by mouth every 4 (four) hours as needed for headache.   Yes [provider]  oseltamivir (TAMIFLU) 75 MG capsule Take 1 capsule (75 mg total) by mouth every 12 (twelve) hours. 11/17/17  Yes [name], PA-C  traMADol (ULTRAM) 50 MG tablet Take 1 tablet (50 mg total) by mouth every 6  (six) hours as needed. Patient not taking: Reported on 11/21/2017 03/23/17   [name], PA-C    Family History No family history on file.  Social History Social History   Tobacco Use  . Smoking status: Current Every Day Smoker    Packs/day: 0.50    Types: Cigarettes  . Smokeless tobacco: Never Used  Substance Use Topics  . Alcohol use: No  . Drug use: Yes    Frequency: 7.0 times per week    Types: IV    Comment: heroin     Allergies   Patient has no known allergies.   Review of Systems Review of Systems  A complete review of systems was obtained and all systems are negative except as noted in the HPI and PMH.   Physical Exam Updated Vital Signs BP (!) 100/48 (BP Location: Right Arm)   Pulse (!) 113   Temp 98.6 F (37 C) (Oral)   Resp (!) 24   Ht 5\' 4"  (1.626 m)   Wt 54.4 kg (120 lb)   SpO2 97%   BMI 20.60 kg/m   Physical Exam  Constitutional: She is oriented to person, place, and time. She appears well-developed and well-nourished. No distress.  HENT:  Head: Normocephalic and atraumatic.  Mouth/Throat: Oropharynx is clear and  moist.  Eyes: Conjunctivae and EOM are normal. Pupils are equal, round, and reactive to light.  Neck: Normal range of motion.  FROM to C-spine. Pt can touch chin to chest without discomfort. No TTP of midline cervical spine.   Cardiovascular: Regular rhythm and intact distal pulses.  Severe tachycardia  Pulmonary/Chest: Effort normal and breath sounds normal. No stridor. No respiratory distress. She has no wheezes. She has no rales. She exhibits no tenderness.  Abdominal: Soft. She exhibits no distension. There is no tenderness. There is no guarding.  Musculoskeletal: Normal range of motion.  Neurological: She is alert and oriented to person, place, and time.  Skin: She is not diaphoretic.  Track marks to right antecubital area with no significant erythema, warmth.  Psychiatric: She has a normal mood and affect.  Nursing note and  vitals reviewed.    ED Treatments / Results  Labs (all labs ordered are listed, but only abnormal results are displayed) Labs Reviewed  CBC WITH DIFFERENTIAL/PLATELET - Abnormal; Notable for the following components:      Result Value   Hemoglobin 10.3 (*)    HCT 31.5 (*)    MCH 25.9 (*)    Neutro Abs 7.9 (*)    Lymphs Abs 0.6 (*)    All other components within normal limits  URINALYSIS, ROUTINE W REFLEX MICROSCOPIC - Abnormal; Notable for the following components:   Hgb urine dipstick SMALL (*)    Protein, ur 30 (*)    Bacteria, UA RARE (*)    Squamous Epithelial / LPF 0-5 (*)    All other components within normal limits  COMPREHENSIVE METABOLIC PANEL - Abnormal; Notable for the following components:   Sodium 134 (*)    Potassium 3.0 (*)    Glucose, Bld 148 (*)    Calcium 7.0 (*)    Total Protein 5.2 (*)    Albumin 2.0 (*)    Alkaline Phosphatase 151 (*)    Total Bilirubin 0.2 (*)    All other components within normal limits  I-STAT CG4 LACTIC ACID, ED - Abnormal; Notable for the following components:   Lactic Acid, Venous 3.49 (*)    All other components within normal limits  I-STAT CHEM 8, ED - Abnormal; Notable for the following components:   Sodium 132 (*)    Chloride 95 (*)    Glucose, Bld 138 (*)    Calcium, Ion 0.99 (*)    All other components within normal limits  I-STAT CG4 LACTIC ACID, ED - Abnormal; Notable for the following components:   Lactic Acid, Venous 2.00 (*)    All other components within normal limits  CULTURE, BLOOD (ROUTINE X 2)  CULTURE, BLOOD (ROUTINE X 2)  URINE CULTURE  INFLUENZA PANEL BY PCR (TYPE A & B)  RPR  HIV ANTIBODY (ROUTINE TESTING)  HEPATITIS PANEL, ACUTE  I-STAT BETA HCG BLOOD, ED ([facility], [facility], [facility] ONLY)  I-STAT CG4 LACTIC ACID, ED  I-STAT CG4 LACTIC ACID, ED    EKG  EKG Interpretation  Date/Time:  Wednesday November 21 2017 14:18:48 EST Ventricular Rate:  172 PR Interval:    QRS Duration: 108 QT Interval:  288 QTC  Calculation: 488 R Axis:   107 Text Interpretation:  Sinus tachycardia Probable anterolateral infarct, recent SINCE LAST TRACING HEART RATE HAS INCREASED Confirmed by Doug SouJacubowitz, Sam [id number]) on 11/21/2017 5:10:17 PM       Radiology Dg Chest 2 View  Result Date: 11/21/2017 CLINICAL DATA:  Palpitations and fever. Last heroin use this morning.  EXAM: CHEST  2 VIEW COMPARISON:  November 17, 2017 FINDINGS: The heart size and mediastinal contours are within normal limits. Both lungs are clear. The visualized skeletal structures are unremarkable. IMPRESSION: No active cardiopulmonary disease. Electronically Signed   By: Gerome Sam III M.D   On: 11/21/2017 17:12   Dg Elbow Complete Left  Result Date: 11/21/2017 CLINICAL DATA:  Intravenous drug use.  Sepsis.  Pain. EXAM: LEFT ELBOW - COMPLETE 3+ VIEW COMPARISON:  None. FINDINGS: Artifact related to overlying IV. Previous ORIF of an olecranon fracture appears unremarkable. Small osteochondroma the anterior distal humerus, not likely of clinical relevance. No sign of osteomyelitis, joint effusion or gas in the soft tissues. IMPRESSION: No acute finding.  See above discussion. Electronically Signed   By: Paulina Fusi M.D.   On: 11/21/2017 17:13    Procedures Procedures (including critical care time)  CRITICAL CARE Performed by: Joni Reining Shanan Mcmiller   Total critical care time: 40 minutes  Critical care time was exclusive of separately billable procedures and treating other patients.  Critical care was necessary to treat or prevent imminent or life-threatening deterioration.  Critical care was time spent personally by me on the following activities: development of treatment plan with patient and/or surrogate as well as nursing, discussions with consultants, evaluation of patient's response to treatment, examination of patient, obtaining history from patient or surrogate, ordering and performing treatments and interventions, ordering and review of  laboratory studies, ordering and review of radiographic studies, pulse oximetry and re-evaluation of patient's condition.   Medications Ordered in ED Medications  ceFEPIme (MAXIPIME) 2 g in dextrose 5 % 50 mL IVPB (2 g Intravenous New Bag/Given 11/21/17 1817)  HYDROmorphone (DILAUDID) injection 0.5 mg (not administered)  sodium chloride 0.9 % bolus 2,000 mL (0 mLs Intravenous Stopped 11/21/17 1711)  acetaminophen (TYLENOL) tablet 1,000 mg (1,000 mg Oral Given 11/21/17 1540)  HYDROmorphone (DILAUDID) injection 0.5 mg (0.5 mg Intravenous Given 11/21/17 1544)  ondansetron (ZOFRAN) injection 4 mg (4 mg Intravenous Given 11/21/17 1543)  oseltamivir (TAMIFLU) capsule 75 mg (75 mg Oral Given 11/21/17 1540)  piperacillin-tazobactam (ZOSYN) IVPB 3.375 g (0 g Intravenous Stopped 11/21/17 1711)  vancomycin (VANCOCIN) IVPB 1000 mg/200 mL premix (0 mg Intravenous Stopped 11/21/17 1819)  sodium chloride 0.9 % bolus 1,000 mL (0 mLs Intravenous Stopped 11/21/17 1819)  LORazepam (ATIVAN) injection 1 mg (1 mg Intravenous Given 11/21/17 1710)  potassium chloride SA (K-DUR,KLOR-CON) CR tablet 40 mEq (40 mEq Oral Given 11/21/17 1814)     Initial Impression / Assessment and Plan / ED Course  I have reviewed the triage vital signs and the nursing notes.  Pertinent labs & imaging results that were available during my care of the patient were reviewed by me and considered in my medical decision making (see chart for details).     Vitals:   11/21/17 1715 11/21/17 1742 11/21/17 1745 11/21/17 1824  BP: 95/61  (!) 85/57 (!) 100/48  Pulse: (!) 119  (!) 105 (!) 113  Resp: 20  (!) 22 (!) 24  Temp:  98.8 F (37.1 C)  98.6 F (37 C)  TempSrc:  Oral  Oral  SpO2: 97%  (!) 85% 97%  Weight:      Height:        Medications  ceFEPIme (MAXIPIME) 2 g in dextrose 5 % 50 mL IVPB (2 g Intravenous New Bag/Given 11/21/17 1817)  HYDROmorphone (DILAUDID) injection 0.5 mg (not administered)  sodium chloride 0.9 % bolus 2,000 mL (0 mLs  Intravenous Stopped 11/21/17  1711)  acetaminophen (TYLENOL) tablet 1,000 mg (1,000 mg Oral Given 11/21/17 1540)  HYDROmorphone (DILAUDID) injection 0.5 mg (0.5 mg Intravenous Given 11/21/17 1544)  ondansetron (ZOFRAN) injection 4 mg (4 mg Intravenous Given 11/21/17 1543)  oseltamivir (TAMIFLU) capsule 75 mg (75 mg Oral Given 11/21/17 1540)  piperacillin-tazobactam (ZOSYN) IVPB 3.375 g (0 g Intravenous Stopped 11/21/17 1711)  vancomycin (VANCOCIN) IVPB 1000 mg/200 mL premix (0 mg Intravenous Stopped 11/21/17 1819)  sodium chloride 0.9 % bolus 1,000 mL (0 mLs Intravenous Stopped 11/21/17 1819)  LORazepam (ATIVAN) injection 1 mg (1 mg Intravenous Given 11/21/17 1710)  potassium chloride SA (K-DUR,KLOR-CON) CR tablet 40 mEq (40 mEq Oral Given 11/21/17 1814)    Carolyn Frey is 37 y.o. female presenting with fever, myalgia, headache.  This is an IV drug user that was seen several days ago and left AGAINST MEDICAL ADVICE, she was given a prescription for Tamiflu but she has been noncompliant with this.  Patient without obvious signs of cellulitis.  Code sepsis initiated, initially patient is started on Vanco and Zosyn however after flu, chest x-ray and urine are negative we will transition her to cefepime to cover for an endocarditis.  I was unable to obtain multiple blood cultures before starting antibiotics as she was septic and needed antibiotics initiated for her safety.  A patient's lactic acid is improving, initially it was 3.5, after 3 L it is 2.0, tachycardia has also resolved.  Discussed with [facility] hospitalist who accepts admission   Final Clinical Impressions(s) / ED Diagnoses   Final diagnoses:  Sepsis, due to unspecified organism [facility])  IVDU (intravenous drug user)    ED Discharge Orders    None       Lynetta Mare Mardella Layman 11/21/17 1833    Bethann Berkshire, MD 11/22/17 1255

## 2017-11-21 NOTE — ED Notes (Signed)
Vitals at 1715 are post 2L bolus

## 2017-11-21 NOTE — ED Triage Notes (Signed)
Patient c/o palpitations and fever since this morning. Patient tachycardic and febrile in triage. Reports last heroin use was at 0500 this morning.

## 2017-11-21 NOTE — Progress Notes (Signed)
Pharmacy Note  A consult was received from an ED physician for vancomycin and Zosyn per pharmacy dosing.  The patient's profile has been reviewed for ht/wt/allergies/indication/available labs.   A one time order has been placed for vancomycin 1000 mg IV x1 and Zosyn 3.375 gr IV x1 .  Further antibiotics/pharmacy consults should be ordered by admitting physician if indicated.                       Thank you,   Adalberto ColeNikola Lurene Robley, PharmD, BCPS Pager [phone] 11/21/2017 4:00 PM

## 2017-11-21 NOTE — ED Notes (Signed)
Bed: [facility] Expected date:  Expected time:  Means of arrival:  Comments: 

## 2017-11-22 ENCOUNTER — Other Ambulatory Visit: Payer: Self-pay

## 2017-11-22 DIAGNOSIS — F17210 Nicotine dependence, cigarettes, uncomplicated: Secondary | ICD-10-CM

## 2017-11-22 DIAGNOSIS — F1920 Other psychoactive substance dependence, uncomplicated: Secondary | ICD-10-CM

## 2017-11-22 DIAGNOSIS — R140 Abdominal distension (gaseous): Secondary | ICD-10-CM

## 2017-11-22 DIAGNOSIS — R768 Other specified abnormal immunological findings in serum: Secondary | ICD-10-CM | POA: Diagnosis present

## 2017-11-22 DIAGNOSIS — B9561 Methicillin susceptible Staphylococcus aureus infection as the cause of diseases classified elsewhere: Secondary | ICD-10-CM

## 2017-11-22 DIAGNOSIS — F1123 Opioid dependence with withdrawal: Secondary | ICD-10-CM

## 2017-11-22 DIAGNOSIS — R7881 Bacteremia: Secondary | ICD-10-CM

## 2017-11-22 DIAGNOSIS — D649 Anemia, unspecified: Secondary | ICD-10-CM | POA: Diagnosis present

## 2017-11-22 LAB — BLOOD CULTURE ID PANEL (REFLEXED)
Acinetobacter baumannii: NOT DETECTED
CANDIDA GLABRATA: NOT DETECTED
Candida albicans: NOT DETECTED
Candida krusei: NOT DETECTED
Candida parapsilosis: NOT DETECTED
Candida tropicalis: NOT DETECTED
Carbapenem resistance: NOT DETECTED
ESCHERICHIA COLI: NOT DETECTED
Enterobacter cloacae complex: NOT DETECTED
Enterobacteriaceae species: NOT DETECTED
Enterococcus species: NOT DETECTED
HAEMOPHILUS INFLUENZAE: NOT DETECTED
Klebsiella oxytoca: NOT DETECTED
Klebsiella pneumoniae: NOT DETECTED
Listeria monocytogenes: NOT DETECTED
METHICILLIN RESISTANCE: NOT DETECTED
Neisseria meningitidis: NOT DETECTED
PSEUDOMONAS AERUGINOSA: NOT DETECTED
Proteus species: NOT DETECTED
STREPTOCOCCUS PYOGENES: NOT DETECTED
Serratia marcescens: NOT DETECTED
Staphylococcus aureus (BCID): DETECTED — AB
Staphylococcus species: DETECTED — AB
Streptococcus agalactiae: NOT DETECTED
Streptococcus pneumoniae: NOT DETECTED
Streptococcus species: NOT DETECTED
Vancomycin resistance: NOT DETECTED

## 2017-11-22 LAB — BASIC METABOLIC PANEL
Anion gap: 5 (ref 5–15)
BUN: 8 mg/dL (ref 6–20)
CHLORIDE: 105 mmol/L (ref 101–111)
CO2: 23 mmol/L (ref 22–32)
CREATININE: 0.92 mg/dL (ref 0.44–1.00)
Calcium: 7.2 mg/dL — ABNORMAL LOW (ref 8.9–10.3)
GFR calc Af Amer: >60 mL/min (ref >60)
Glucose, Bld: 148 mg/dL — ABNORMAL HIGH (ref 65–99)
Potassium: 4 mmol/L (ref 3.5–5.1)
SODIUM: 133 mmol/L — AB (ref 135–145)

## 2017-11-22 LAB — HEPATITIS PANEL, ACUTE
HEP B C IGM: NEGATIVE
Hep A IgM: NEGATIVE
Hepatitis B Surface Ag: NEGATIVE

## 2017-11-22 LAB — MAGNESIUM: MAGNESIUM: 2.1 mg/dL (ref 1.7–2.4)

## 2017-11-22 LAB — RPR: RPR Ser Ql: NONREACTIVE

## 2017-11-22 LAB — HIV ANTIBODY (ROUTINE TESTING W REFLEX): HIV Screen 4th Generation wRfx: NONREACTIVE

## 2017-11-22 MED ORDER — CLONIDINE HCL 0.1 MG PO TABS: 0.1000 mg | ORAL_TABLET | ORAL | Status: DC

## 2017-11-22 MED ORDER — ONDANSETRON 4 MG PO TBDP: 4.0000 mg | ORAL_TABLET | Freq: Four times a day (QID) | ORAL | Status: DC | PRN

## 2017-11-22 MED ORDER — CLONIDINE HCL 0.1 MG PO TABS: 0.1000 mg | ORAL_TABLET | Freq: Four times a day (QID) | ORAL | Status: DC

## 2017-11-22 MED ORDER — METHOCARBAMOL 500 MG PO TABS: 500.0000 mg | ORAL_TABLET | Freq: Three times a day (TID) | ORAL | Status: DC | PRN

## 2017-11-22 MED ORDER — NICOTINE 14 MG/24HR TD PT24: 14.0000 mg | MEDICATED_PATCH | Freq: Every day | TRANSDERMAL | Status: DC

## 2017-11-22 MED ORDER — SODIUM CHLORIDE 0.9 % IV SOLN: INTRAVENOUS | Status: DC

## 2017-11-22 MED ORDER — DICYCLOMINE HCL 20 MG PO TABS: 20.0000 mg | ORAL_TABLET | Freq: Four times a day (QID) | ORAL | Status: DC | PRN

## 2017-11-22 MED ORDER — CLONIDINE HCL 0.1 MG PO TABS: 0.1000 mg | ORAL_TABLET | Freq: Every day | ORAL | Status: DC

## 2017-11-22 MED ORDER — NAPROXEN 500 MG PO TABS: 500.0000 mg | ORAL_TABLET | Freq: Two times a day (BID) | ORAL | Status: DC | PRN

## 2017-11-22 MED ORDER — LOPERAMIDE HCL 2 MG PO CAPS: 2.0000 mg | ORAL_CAPSULE | ORAL | Status: DC | PRN

## 2017-11-22 MED ORDER — HYDROXYZINE HCL 25 MG PO TABS: 25.0000 mg | ORAL_TABLET | Freq: Four times a day (QID) | ORAL | Status: DC | PRN

## 2017-11-22 NOTE — Progress Notes (Signed)
Unable to complete nursing admission history. Pt starts yelling and then cursing. States she "is withdrawing like a mf and nobody gives a s--t. Explained to pt that we will not do history if she does not feel up to it. She states she will answer questions and then she starts screaming and cursing again. Unable to complete nursing admission hx. Pt's rn informed.When asked what belongings she has, she continually states "my stuff" and will not elaborate what she has with her.  Briscoe Burns. Carleane Nocole Zammit BSN, RN-BC Admissions RN 11/22/2017 7:56 PM

## 2017-11-22 NOTE — Progress Notes (Signed)
PROGRESS NOTE  THETA LEAF VWU:[id number] DOB: [birth year] DOA: 11/21/2017 PCP: Patient, No Pcp Per  HPI/Recap of past 24 hours:  She does not want to talk, she replies " I just want to sleep"  Assessment/Plan: Active Problems:   Bacteremia   Heroin abuse (HCC) IV drug  MSSA bacteremia from iv drug use, sepsis presented on admission with fever 103, sinus tachycardia, heart rate 162, rr 22-24, blood pressure 85/57, lactic acidosis lactic acid 3.49,  She is started on ancef, ivf, improving Echo pending Repeat blood culture pending ID consulted  Hyponatremia/hypokalemia/hypomagnesimia:  Replaced, repeat in am  Heroin Abuse-last IV drug use  a.m on 2/6  -she is started on Norco prn -start on clonidine detox protocol -hiv negative, hepC +    Smoker: nicotine patch   Code Status: full  Family Communication: patient   Disposition Plan: remain in the hospital   Consultants:  ID  Procedures:  none  Antibiotics:  ancef   Objective: BP 100/60 (BP Location: Right Arm)   Pulse 100   Temp 98.6 F (37 C) (Oral)   Resp 20   Ht 5\' 4"  (1.626 m)   Wt 54.4 kg (120 lb)   SpO2 100%   BMI 20.60 kg/m   Intake/Output Summary (Last 24 hours) at 11/22/2017 0813 Last data filed at 11/22/2017 0018 Gross per 24 hour  Intake 3500 ml  Output -  Net 3500 ml   Filed Weights   11/21/17 1440 11/21/17 1553  Weight: 54.4 kg (120 lb) 54.4 kg (120 lb)    Exam: Patient is examined daily including today on 11/22/2017, exams remain the same as of yesterday except that has changed    General:  NAD  Cardiovascular: RRR  Respiratory: CTABL  Abdomen: Soft/ND/NT, positive BS  Musculoskeletal: No Edema  Neuro: alert, oriented   Data Reviewed: Basic Metabolic Panel: Recent Labs  Lab 11/17/17 1735 11/21/17 1528 11/21/17 1622 11/21/17 1646  NA 130* 132* 134*  --   K 3.8 4.2 3.0*  --   CL 97* 95* 103  --   CO2 23  --  25  --   GLUCOSE 134* 138* 148*  --   BUN 9 11 11    --   CREATININE 0.95 0.90 0.94  --   CALCIUM 8.7*  --  7.0*  --   MG  --   --   --  1.4*   Liver Function Tests: Recent Labs  Lab 11/17/17 1735 11/21/17 1622  AST 116* 32  ALT 83* 29  ALKPHOS 151* 151*  BILITOT 0.5 0.2*  PROT 7.1 5.2*  ALBUMIN 3.0* 2.0*   No results for input(s): LIPASE, AMYLASE in the last 168 hours. No results for input(s): AMMONIA in the last 168 hours. CBC: Recent Labs  Lab 11/17/17 1735 11/21/17 1450 11/21/17 1528  WBC 7.9 8.8  --   NEUTROABS 7.0 7.9*  --   HGB 10.7* 10.3* 12.6  HCT 32.7* 31.5* 37.0  MCV 81.1 79.1  --   PLT 215 179  --    Cardiac Enzymes:   No results for input(s): CKTOTAL, CKMB, CKMBINDEX, TROPONINI in the last 168 hours. BNP (last 3 results) No results for input(s): BNP in the last 8760 hours.  ProBNP (last 3 results) No results for input(s): PROBNP in the last 8760 hours.  CBG: No results for input(s): GLUCAP in the last 168 hours.  Recent Results (from the past 240 hour(s))  Rapid strep screen     Status: None  Collection Time: 11/17/17  4:46 PM  Result Value Ref Range Status   Streptococcus, Group A Screen (Direct) NEGATIVE NEGATIVE Final    Comment: (NOTE) A Rapid Antigen test may result negative if the antigen level in the sample is below the detection level of this test. The FDA has not cleared this test as a stand-alone test therefore the rapid antigen negative result has reflexed to a Group A Strep culture. Performed at [facility] [place], [address] [place], [place] [address]   Culture, group A strep     Status: None   Collection Time: 11/17/17  4:46 PM  Result Value Ref Range Status   Specimen Description   Final    THROAT Performed at [facility] [place], [address] [place], [place] [address]    Special Requests   Final    NONE Reflexed from [id number] Performed at [facility] [place], [address] [place], [place] [address]    Culture   Final    NO GROUP A STREP (S.PYOGENES)  ISOLATED Performed at [facility], [address] [place], [place] [address]    Report Status 11/20/2017 FINAL  Final  Blood Culture (routine x 2)     Status: Abnormal   Collection Time: 11/17/17  5:15 PM  Result Value Ref Range Status   Specimen Description   Final    BLOOD LEFT ANTECUBITAL Performed at [facility] [place], [address] [place], [place] [address]    Special Requests   Final    BOTTLES DRAWN AEROBIC AND ANAEROBIC Blood Culture adequate volume Performed at [facility] [place], [address] [place], [place] [address]    Culture  Setup Time   Final    GRAM POSITIVE COCCI IN BOTH AEROBIC AND ANAEROBIC BOTTLES CRITICAL RESULT CALLED TO, READ BACK BY AND VERIFIED WITH: K CONNOR,RN AT 1251 11/18/17 BY L BENFIELD    Culture (A)  Final    STAPHYLOCOCCUS AUREUS SUSCEPTIBILITIES PERFORMED ON PREVIOUS CULTURE WITHIN THE LAST 5 DAYS. Performed at [facility], [address] [place], [place] [address]    Report Status 11/20/2017 FINAL  Final  Blood Culture ID Panel (Reflexed)     Status: Abnormal   Collection Time: 11/17/17  5:15 PM  Result Value Ref Range Status   Enterococcus species NOT DETECTED NOT DETECTED Final   Listeria monocytogenes NOT DETECTED NOT DETECTED Final   Staphylococcus species DETECTED (A) NOT DETECTED Final    Comment: CRITICAL RESULT CALLED TO, READ BACK BY AND VERIFIED WITH: K CONNOR,RN AT 1251 11/18/17 BY L BENFIELD    Staphylococcus aureus DETECTED (A) NOT DETECTED Final    Comment: Methicillin (oxacillin) susceptible Staphylococcus aureus (MSSA). Preferred therapy is anti staphylococcal beta lactam antibiotic (Cefazolin or Nafcillin), unless clinically contraindicated. CRITICAL RESULT CALLED TO, READ BACK BY AND VERIFIED WITH: K CONNOR,RN AT 1251 11/18/17 BY L BENFIELD    Methicillin resistance NOT DETECTED NOT DETECTED Final   Streptococcus species NOT DETECTED NOT DETECTED Final   Streptococcus agalactiae NOT DETECTED  NOT DETECTED Final   Streptococcus pneumoniae NOT DETECTED NOT DETECTED Final   Streptococcus pyogenes NOT DETECTED NOT DETECTED Final   Acinetobacter baumannii NOT DETECTED NOT DETECTED Final   Enterobacteriaceae species NOT DETECTED NOT DETECTED Final   Enterobacter cloacae complex NOT DETECTED NOT DETECTED Final   Escherichia coli NOT DETECTED NOT DETECTED Final   Klebsiella oxytoca NOT DETECTED NOT DETECTED Final  Klebsiella pneumoniae NOT DETECTED NOT DETECTED Final   Proteus species NOT DETECTED NOT DETECTED Final   Serratia marcescens NOT DETECTED NOT DETECTED Final   Haemophilus influenzae NOT DETECTED NOT DETECTED Final   Neisseria meningitidis NOT DETECTED NOT DETECTED Final   Pseudomonas aeruginosa NOT DETECTED NOT DETECTED Final   Candida albicans NOT DETECTED NOT DETECTED Final   Candida glabrata NOT DETECTED NOT DETECTED Final   Candida krusei NOT DETECTED NOT DETECTED Final   Candida parapsilosis NOT DETECTED NOT DETECTED Final   Candida tropicalis NOT DETECTED NOT DETECTED Final    Comment: Performed at [facility], [address], [place], [place] [address]  Respiratory Panel by PCR     Status: None   Collection Time: 11/17/17  5:41 PM  Result Value Ref Range Status   Adenovirus NOT DETECTED NOT DETECTED Final   Coronavirus 229E NOT DETECTED NOT DETECTED Final   Coronavirus HKU1 NOT DETECTED NOT DETECTED Final   Coronavirus NL63 NOT DETECTED NOT DETECTED Final   Coronavirus OC43 NOT DETECTED NOT DETECTED Final   Metapneumovirus NOT DETECTED NOT DETECTED Final   Rhinovirus / Enterovirus NOT DETECTED NOT DETECTED Final   Influenza A NOT DETECTED NOT DETECTED Final   Influenza B NOT DETECTED NOT DETECTED Final   Parainfluenza Virus 1 NOT DETECTED NOT DETECTED Final   Parainfluenza Virus 2 NOT DETECTED NOT DETECTED Final   Parainfluenza Virus 3 NOT DETECTED NOT DETECTED Final   Parainfluenza Virus 4 NOT DETECTED NOT DETECTED Final   Respiratory Syncytial Virus  NOT DETECTED NOT DETECTED Final   Bordetella pertussis NOT DETECTED NOT DETECTED Final   Chlamydophila pneumoniae NOT DETECTED NOT DETECTED Final   Mycoplasma pneumoniae NOT DETECTED NOT DETECTED Final    Comment: Performed at [facility], [address] [place], [place] [address]  Blood Culture (routine x 2)     Status: Abnormal   Collection Time: 11/17/17  5:56 PM  Result Value Ref Range Status   Specimen Description   Final    BLOOD RIGHT FOREARM Performed at [facility], [address] [place], [place] [address]    Special Requests   Final    BOTTLES DRAWN AEROBIC AND ANAEROBIC Blood Culture results may not be optimal due to an inadequate volume of blood received in culture bottles Performed at [facility], [address] [place], [place] [address]    Culture  Setup Time   Final    GRAM POSITIVE COCCI IN BOTH AEROBIC AND ANAEROBIC BOTTLES CRITICAL RESULT CALLED TO, READ BACK BY AND VERIFIED WITH: K CONNOR,RN AT 1251 11/18/17 BY L BENFIELD Performed at [facility], [address], [place], [place] [address]    Culture STAPHYLOCOCCUS AUREUS (A)  Final   Report Status 11/20/2017 FINAL  Final   Organism ID, Bacteria STAPHYLOCOCCUS AUREUS  Final      Susceptibility   Staphylococcus aureus - MIC*    CIPROFLOXACIN <=0.5 SENSITIVE Sensitive     ERYTHROMYCIN >=8 RESISTANT Resistant     GENTAMICIN <=0.5 SENSITIVE Sensitive     OXACILLIN <=0.25 SENSITIVE Sensitive     TETRACYCLINE >=16 RESISTANT Resistant     VANCOMYCIN 1 SENSITIVE Sensitive     TRIMETH/SULFA <=10 SENSITIVE Sensitive     CLINDAMYCIN <=0.25 SENSITIVE Sensitive     RIFAMPIN <=0.5 SENSITIVE Sensitive     Inducible Clindamycin NEGATIVE Sensitive     * STAPHYLOCOCCUS AUREUS  Blood culture (routine x 2)  Status: None (Preliminary result)   Collection Time: 11/21/17  4:22 PM  Result Value Ref Range Status   Specimen Description   Final    BLOOD LEFT ANTECUBITAL Performed at  [facility], [address], [place], [place] [address]    Special Requests   Final    BOTTLES DRAWN AEROBIC AND ANAEROBIC Blood Culture adequate volume Performed at [facility], [address], [place], [place] [address]    Culture  Setup Time   Final    GRAM POSITIVE COCCI IN BOTH AEROBIC AND ANAEROBIC BOTTLES Organism ID to follow Performed at [facility], [address] [place], [place] [address]    Culture PENDING  Incomplete   Report Status PENDING  Incomplete  Blood culture (routine x 2)     Status: None (Preliminary result)   Collection Time: 11/21/17  4:22 PM  Result Value Ref Range Status   Specimen Description   Final    BLOOD LEFT HAND Performed at [facility], [address], [place], [place] [address]    Special Requests   Final    BOTTLES DRAWN AEROBIC AND ANAEROBIC Blood Culture adequate volume Performed at [facility], [address], [place], [place] [address]    Culture  Setup Time   Final    GRAM POSITIVE COCCI IN BOTH AEROBIC AND ANAEROBIC BOTTLES Performed at [facility], [address], [place], [place] [address]    Culture GRAM POSITIVE COCCI  Final   Report Status PENDING  Incomplete     Studies: Dg Chest 2 View  Result Date: 11/21/2017 CLINICAL DATA:  Palpitations and fever. Last heroin use this morning. EXAM: CHEST  2 VIEW COMPARISON:  November 17, 2017 FINDINGS: The heart size and mediastinal contours are within normal limits. Both lungs are clear. The visualized skeletal structures are unremarkable. IMPRESSION: No active cardiopulmonary disease. Electronically Signed   By: Gerome Sam III M.D   On: 11/21/2017 17:12   Dg Elbow Complete Left  Result Date: 11/21/2017 CLINICAL DATA:  Intravenous drug use.  Sepsis.  Pain. EXAM: LEFT ELBOW - COMPLETE 3+ VIEW COMPARISON:  None. FINDINGS: Artifact related to overlying IV. Previous ORIF of an olecranon fracture  appears unremarkable. Small osteochondroma the anterior distal humerus, not likely of clinical relevance. No sign of osteomyelitis, joint effusion or gas in the soft tissues. IMPRESSION: No acute finding.  See above discussion. Electronically Signed   By: Paulina Fusi M.D.   On: 11/21/2017 17:13    Scheduled Meds:  Continuous Infusions: . 0.9 % NaCl with KCl 20 mEq / L 100 mL/hr at 11/22/17 0005  .  ceFAZolin (ANCEF) IV Stopped (11/22/17 0753)     Time spent: I have personally reviewed and interpreted on  11/22/2017 daily labs, tele strips, imagings as discussed above under date review session and assessment and plans.  I reviewed all nursing notes, pharmacy notes, consultant notes,  vitals, pertinent old records  I have discussed plan of care as described above with RN , patient  on 11/22/2017   Albertine Grates MD, PhD  [facility] Pager [phone]. If 7PM-7AM, please contact night-coverage at [email or web address], password [facility] 11/22/2017, 8:13 AM  LOS: 1 day

## 2017-11-22 NOTE — ED Notes (Signed)
Patient will not keep on cardiac monitor, bp cuff, nor O2 sensor

## 2017-11-22 NOTE — ED Notes (Signed)
Bed: WA20 Expected date:  Expected time:  Means of arrival:  Comments: Room 1 

## 2017-11-22 NOTE — ED Notes (Signed)
Pt is awake and reports that she wants to go outside and smoke. Pt was responded that was not  allowed and that she was being admitted. Pt then replied " I can leave if I want to:" pt was made aware that her being in the hospital was her decision, and her Dr's consider her to be very sick and that is why they were admitting her. Pt continues to states she wanted to leave and stated " I cant deal with theses people". Pt was encouraged to stay and made Pt aware that our intention was to help her get the care in which she needs.

## 2017-11-22 NOTE — Progress Notes (Signed)
PHARMACY - PHYSICIAN COMMUNICATION CRITICAL VALUE ALERT - BLOOD CULTURE IDENTIFICATION (BCID)  Carolyn Frey is an 37 y.o. female who [facility]2017 with a chief complaint of generalized body aches and fever.  Assessment:  [facility], blood work was drawn but patient left AMA.  Patient had a lactic acidosis and blood cultures grew MSSA.   (include suspected source if known)  Name of physician (or Provider) Contacted: Xu  Current antibiotics: Cefazolin 2g IV q8h  Changes to prescribed antibiotics recommended:  Patient is on recommended antibiotics - No changes needed  Results for orders placed or performed during the hospital encounter of 11/21/17  Blood Culture ID Panel (Reflexed) (Collected: 11/21/2017  4:22 PM)  Result Value Ref Range   Enterococcus species NOT DETECTED NOT DETECTED   Vancomycin resistance NOT DETECTED NOT DETECTED   Listeria monocytogenes NOT DETECTED NOT DETECTED   Staphylococcus species DETECTED (A) NOT DETECTED   Staphylococcus aureus DETECTED (A) NOT DETECTED   Methicillin resistance NOT DETECTED NOT DETECTED   Streptococcus species NOT DETECTED NOT DETECTED   Streptococcus agalactiae NOT DETECTED NOT DETECTED   Streptococcus pneumoniae NOT DETECTED NOT DETECTED   Streptococcus pyogenes NOT DETECTED NOT DETECTED   Acinetobacter baumannii NOT DETECTED NOT DETECTED   Enterobacteriaceae species NOT DETECTED NOT DETECTED   Enterobacter cloacae complex NOT DETECTED NOT DETECTED   Escherichia coli NOT DETECTED NOT DETECTED   Klebsiella oxytoca NOT DETECTED NOT DETECTED   Klebsiella pneumoniae NOT DETECTED NOT DETECTED   Proteus species NOT DETECTED NOT DETECTED   Serratia marcescens NOT DETECTED NOT DETECTED   Carbapenem resistance NOT DETECTED NOT DETECTED   Haemophilus influenzae NOT DETECTED NOT DETECTED   Neisseria meningitidis NOT DETECTED NOT DETECTED   Pseudomonas aeruginosa NOT  DETECTED NOT DETECTED   Candida albicans NOT DETECTED NOT DETECTED   Candida glabrata NOT DETECTED NOT DETECTED   Candida krusei NOT DETECTED NOT DETECTED   Candida parapsilosis NOT DETECTED NOT DETECTED   Candida tropicalis NOT DETECTED NOT DETECTED    Loralee PacasErin Hollye Pritt, PharmD, BCPS 11/22/2017  10:15 AM

## 2017-11-22 NOTE — ED Notes (Signed)
Pt was observed dressed and walking out of the room stated that she was  Going to smoke a cigarette, Pt had removed all IV's and was accompanied with a friend,

## 2017-11-22 NOTE — Consult Note (Signed)
[facility]    Date of Admission:  11/21/2017           Day 2 cefazolin       Reason for Consult: Automatic consultation for staph aureus bacteremia     Assessment: She has MSSA bacteremia and is at high risk for endocarditis.  Repeat blood cultures are pending.  Ideally she needs continued IV cefazolin and echocardiography but unfortunately it appears that the pull of her addiction is winning.  She is threatening to leave AGAINST MEDICAL ADVICE so if her withdrawal is not addressed.  She tells me that the low-dose hydrocodone that she is being given is simply like candy to her given the high doses here when she has been injecting for the past 10 years.  She was on methadone until 1 year ago stating convinced her to stop it.  She is also taking Subutex in the past.  These have helped with her cravings.  She says that she cannot tolerate Suboxone because of headaches.  Feel it is in her best interest if we try to help her manage her withdrawal and addiction so that she can stay in a supervised setting for intravenous antibiotics.  Early discharge on oral antibiotics is certainly not the standard of care.  Plan: 1. Attempt to manage her withdrawal and addiction as best as possible or setting 2. Continue cefazolin 3. Await results of repeat blood cultures and echocardiography 4. I will follow-up in the morning  Active Problems:   Bacteremia due to methicillin susceptible Staphylococcus aureus (MSSA)   IVDU (intravenous drug user)   Hepatitis C antibody test positive   Drug addiction (HCC)   Cigarette smoker   Normocytic anemia   Scheduled Meds: Continuous Infusions: .  ceFAZolin (ANCEF) IV 2 g (11/22/17 1513)   PRN Meds:.HYDROcodone-acetaminophen  HPI: Carolyn Frey is a 37 y.o. female with a 10-year history of polysubstance abuse addiction and intravenous drug use.  She was seen on 11/17/2017 with fever.  She was tachycardic with an elevated lactic  acid.  Blood cultures were drawn but she left AGAINST MEDICAL ADVICE.  She returned yesterday with persistent fever, severe headache, progressive myalgias and arthralgias.  Blood cultures were repeated.  All blood cultures are growing MSSA.   Review of Systems: Review of Systems  Constitutional: Positive for chills, diaphoresis, fever and malaise/fatigue. Negative for weight loss.  HENT: Negative for congestion and sore throat.   Respiratory: Negative for cough, sputum production and shortness of breath.   Cardiovascular: Negative for chest pain.  Gastrointestinal: Positive for nausea. Negative for abdominal pain, diarrhea and vomiting.  Genitourinary: Negative for dysuria.  Musculoskeletal: Positive for joint pain and myalgias.  Skin: Negative for rash.  Neurological: Positive for headaches.  Psychiatric/Behavioral: Positive for substance abuse.    Past Medical History:  Diagnosis Date  . Substance abuse (HCC)     Social History   Tobacco Use  . Smoking status: Current Every Day Smoker    Packs/day: 0.50    Types: Cigarettes  . Smokeless tobacco: Never Used  Substance Use Topics  . Alcohol use: No  . Drug use: Yes    Frequency: 7.0 times per week    Types: IV    Comment: heroin    No family history on file. No Known Allergies  OBJECTIVE: Blood pressure 120/79, pulse 98, temperature 97.9 F (36.6 C), temperature source Oral, resp. rate 12, height 5\' 4"  (1.626 m), weight  120 lb (54.4 kg), SpO2 100 %, unknown if currently breastfeeding.  Physical Exam  Constitutional: She is oriented to person, place, and time.  She is alert and talkative.  She is comfortable.  He is tremulous.  She is pleading with me to treat her addiction or let her leave the hospital with oral antibiotics.  HENT:  Mouth/Throat: No oropharyngeal exudate.  Eyes: Conjunctivae are normal.  Neck: Neck supple.  Cardiovascular: Regular rhythm.  No murmur heard. She is tachycardic.  Pulmonary/Chest:  Effort normal. She has no wheezes. She has no rales.  Abdominal: She exhibits distension. There is no tenderness. There is no rebound.  Musculoskeletal: Normal range of motion. She exhibits no edema or tenderness.  Neurological: She is alert and oriented to person, place, and time.  Skin: No rash noted.  Multiple track marks.  Psychiatric: Affect normal.  She is in distress.    Lab Results Lab Results  Component Value Date   WBC 8.8 11/21/2017   HGB 12.6 11/21/2017   HCT 37.0 11/21/2017   MCV 79.1 11/21/2017   PLT 179 11/21/2017    Lab Results  Component Value Date   CREATININE 0.92 11/22/2017   BUN 8 11/22/2017   NA 133 (L) 11/22/2017   K 4.0 11/22/2017   CL 105 11/22/2017   CO2 23 11/22/2017    Lab Results  Component Value Date   ALT 29 11/21/2017   AST 32 11/21/2017   ALKPHOS 151 (H) 11/21/2017   BILITOT 0.2 (L) 11/21/2017     Microbiology: Recent Results (from the past 240 hour(s))  Rapid strep screen     Status: None   Collection Time: 11/17/17  4:46 PM  Result Value Ref Range Status   Streptococcus, Group A Screen (Direct) NEGATIVE NEGATIVE Final    Comment: (NOTE) A Rapid Antigen test may result negative if the antigen level in the sample is below the detection level of this test. The FDA has not cleared this test as a stand-alone test therefore the rapid antigen negative result has reflexed to a Group A Strep culture. Performed at [facility] [place], [address] [place], [place] [address]   Culture, group A strep     Status: None   Collection Time: 11/17/17  4:46 PM  Result Value Ref Range Status   Specimen Description   Final    THROAT Performed at [facility] [place], [address] [place], [place] [address]    Special Requests   Final    NONE Reflexed from [id number] Performed at [facility] [place], [address] [place], [place] [address]    Culture   Final    NO GROUP A STREP (S.PYOGENES) ISOLATED Performed at [facility], [address] [place], [place] [address]    Report Status 11/20/2017 FINAL  Final  Blood Culture (routine x 2)     Status: Abnormal   Collection Time: 11/17/17  5:15 PM  Result Value Ref Range Status   Specimen Description   Final    BLOOD LEFT ANTECUBITAL Performed at [facility] [place], [address] [place], [place] [address]    Special Requests   Final    BOTTLES DRAWN AEROBIC AND ANAEROBIC Blood Culture adequate volume Performed at [facility] [place], [address] [place], [place] [address]    Culture  Setup Time   Final    GRAM POSITIVE COCCI IN BOTH AEROBIC AND ANAEROBIC BOTTLES CRITICAL  RESULT CALLED TO, READ BACK BY AND VERIFIED WITH: K CONNOR,RN AT 1251 11/18/17 BY L BENFIELD    Culture (A)  Final    STAPHYLOCOCCUS AUREUS SUSCEPTIBILITIES PERFORMED ON PREVIOUS CULTURE WITHIN THE LAST 5 DAYS. Performed at [facility], [address], [place], [place] [address]    Report Status 11/20/2017 FINAL  Final  Blood Culture ID Panel (Reflexed)     Status: Abnormal   Collection Time: 11/17/17  5:15 PM  Result Value Ref Range Status   Enterococcus species NOT DETECTED NOT DETECTED Final   Listeria monocytogenes NOT DETECTED NOT DETECTED Final   Staphylococcus species DETECTED (A) NOT DETECTED Final    Comment: CRITICAL RESULT CALLED TO, READ BACK BY AND VERIFIED WITH: K CONNOR,RN AT 1251 11/18/17 BY L BENFIELD    Staphylococcus aureus DETECTED (A) NOT DETECTED Final    Comment: Methicillin (oxacillin) susceptible Staphylococcus aureus (MSSA). Preferred therapy is anti staphylococcal beta lactam antibiotic (Cefazolin or Nafcillin), unless clinically contraindicated. CRITICAL RESULT CALLED TO, READ BACK BY AND VERIFIED WITH: K CONNOR,RN AT 1251 11/18/17 BY L BENFIELD    Methicillin resistance NOT DETECTED NOT DETECTED Final   Streptococcus species NOT DETECTED NOT DETECTED Final   Streptococcus agalactiae NOT DETECTED NOT DETECTED Final   Streptococcus  pneumoniae NOT DETECTED NOT DETECTED Final   Streptococcus pyogenes NOT DETECTED NOT DETECTED Final   Acinetobacter baumannii NOT DETECTED NOT DETECTED Final   Enterobacteriaceae species NOT DETECTED NOT DETECTED Final   Enterobacter cloacae complex NOT DETECTED NOT DETECTED Final   Escherichia coli NOT DETECTED NOT DETECTED Final   Klebsiella oxytoca NOT DETECTED NOT DETECTED Final   Klebsiella pneumoniae NOT DETECTED NOT DETECTED Final   Proteus species NOT DETECTED NOT DETECTED Final   Serratia marcescens NOT DETECTED NOT DETECTED Final   Haemophilus influenzae NOT DETECTED NOT DETECTED Final   Neisseria meningitidis NOT DETECTED NOT DETECTED Final   Pseudomonas aeruginosa NOT DETECTED NOT DETECTED Final   Candida albicans NOT DETECTED NOT DETECTED Final   Candida glabrata NOT DETECTED NOT DETECTED Final   Candida krusei NOT DETECTED NOT DETECTED Final   Candida parapsilosis NOT DETECTED NOT DETECTED Final   Candida tropicalis NOT DETECTED NOT DETECTED Final    Comment: Performed at [facility], [address], [place], [place] [address]  Respiratory Panel by PCR     Status: None   Collection Time: 11/17/17  5:41 PM  Result Value Ref Range Status   Adenovirus NOT DETECTED NOT DETECTED Final   Coronavirus 229E NOT DETECTED NOT DETECTED Final   Coronavirus HKU1 NOT DETECTED NOT DETECTED Final   Coronavirus NL63 NOT DETECTED NOT DETECTED Final   Coronavirus OC43 NOT DETECTED NOT DETECTED Final   Metapneumovirus NOT DETECTED NOT DETECTED Final   Rhinovirus / Enterovirus NOT DETECTED NOT DETECTED Final   Influenza A NOT DETECTED NOT DETECTED Final   Influenza B NOT DETECTED NOT DETECTED Final   Parainfluenza Virus 1 NOT DETECTED NOT DETECTED Final   Parainfluenza Virus 2 NOT DETECTED NOT DETECTED Final   Parainfluenza Virus 3 NOT DETECTED NOT DETECTED Final   Parainfluenza Virus 4 NOT DETECTED NOT DETECTED Final   Respiratory Syncytial Virus NOT DETECTED NOT DETECTED Final    Bordetella pertussis NOT DETECTED NOT DETECTED Final   Chlamydophila pneumoniae NOT DETECTED NOT DETECTED Final   Mycoplasma pneumoniae NOT DETECTED NOT DETECTED Final    Comment: Performed at [facility], [address], [place], [place] [address]  Blood Culture (  routine x 2)     Status: Abnormal   Collection Time: 11/17/17  5:56 PM  Result Value Ref Range Status   Specimen Description   Final    BLOOD RIGHT FOREARM Performed at [facility], [address] [place], [place] [address]    Special Requests   Final    BOTTLES DRAWN AEROBIC AND ANAEROBIC Blood Culture results may not be optimal due to an inadequate volume of blood received in culture bottles Performed at [facility], [address] [place], [place] [address]    Culture  Setup Time   Final    GRAM POSITIVE COCCI IN BOTH AEROBIC AND ANAEROBIC BOTTLES CRITICAL RESULT CALLED TO, READ BACK BY AND VERIFIED WITH: K CONNOR,RN AT 1251 11/18/17 BY L BENFIELD Performed at [facility], [address], [place], [place] [address]    Culture STAPHYLOCOCCUS AUREUS (A)  Final   Report Status 11/20/2017 FINAL  Final   Organism ID, Bacteria STAPHYLOCOCCUS AUREUS  Final      Susceptibility   Staphylococcus aureus - MIC*    CIPROFLOXACIN <=0.5 SENSITIVE Sensitive     ERYTHROMYCIN >=8 RESISTANT Resistant     GENTAMICIN <=0.5 SENSITIVE Sensitive     OXACILLIN <=0.25 SENSITIVE Sensitive     TETRACYCLINE >=16 RESISTANT Resistant     VANCOMYCIN 1 SENSITIVE Sensitive     TRIMETH/SULFA <=10 SENSITIVE Sensitive     CLINDAMYCIN <=0.25 SENSITIVE Sensitive     RIFAMPIN <=0.5 SENSITIVE Sensitive     Inducible Clindamycin NEGATIVE Sensitive     * STAPHYLOCOCCUS AUREUS  Blood culture (routine x 2)     Status: None (Preliminary result)   Collection Time: 11/21/17  4:22 PM  Result Value Ref Range Status   Specimen Description   Final    BLOOD LEFT ANTECUBITAL Performed at [facility], [address] [place], [place] [address]    Special Requests   Final    BOTTLES DRAWN AEROBIC AND ANAEROBIC Blood Culture adequate volume Performed at [facility], [address] [place], [place] [address]    Culture  Setup Time   Final    GRAM POSITIVE COCCI IN BOTH AEROBIC AND ANAEROBIC BOTTLES CRITICAL RESULT CALLED TO, READ BACK BY AND VERIFIED WITH: Neta Mends [id number] MLM Performed at [facility], [address] [place], [place] [address]    Culture GRAM POSITIVE COCCI  Final   Report Status PENDING  Incomplete  Blood culture (routine x 2)     Status: None (Preliminary result)   Collection Time: 11/21/17  4:22 PM  Result Value Ref Range Status   Specimen Description   Final    BLOOD LEFT HAND Performed at [facility], [address], [place], [place] [address]    Special Requests   Final    BOTTLES DRAWN AEROBIC AND ANAEROBIC Blood Culture adequate volume Performed at [facility], [address], [place], [place] [address]    Culture  Setup Time   Final    GRAM POSITIVE COCCI IN BOTH AEROBIC AND ANAEROBIC BOTTLES Performed at [facility], [address], [place], [place] [address]    Culture [facility] POSITIVE COCCI  Final   Report Status PENDING  Incomplete  Blood Culture ID Panel (Reflexed)     Status: Abnormal   Collection Time: 11/21/17  4:22 PM  Result Value Ref Range Status   Enterococcus species NOT DETECTED  NOT DETECTED Final   Vancomycin resistance NOT DETECTED NOT DETECTED Final   Listeria monocytogenes NOT DETECTED NOT DETECTED Final   Staphylococcus species DETECTED (A) NOT DETECTED Final    Comment: CRITICAL RESULT CALLED TO, READ BACK BY AND VERIFIED WITH: PHARMD E WILLIAMSON [id number] MLM    Staphylococcus aureus DETECTED (A) NOT DETECTED Final    Comment: CRITICAL RESULT CALLED TO, READ BACK BY AND VERIFIED WITH: PHARMD E [place] Sawyer [phone] MLM Methicillin (oxacillin)  susceptible Staphylococcus aureus (MSSA). Preferred therapy is anti staphylococcal beta lactam antibiotic (Cefazolin or Nafcillin), unless clinically contraindicated.    Methicillin resistance NOT DETECTED NOT DETECTED Final   Streptococcus species NOT DETECTED NOT DETECTED Final   Streptococcus agalactiae NOT DETECTED NOT DETECTED Final   Streptococcus pneumoniae NOT DETECTED NOT DETECTED Final   Streptococcus pyogenes NOT DETECTED NOT DETECTED Final   Acinetobacter baumannii NOT DETECTED NOT DETECTED Final   Enterobacteriaceae species NOT DETECTED NOT DETECTED Final   Enterobacter cloacae complex NOT DETECTED NOT DETECTED Final   Escherichia coli NOT DETECTED NOT DETECTED Final   Klebsiella oxytoca NOT DETECTED NOT DETECTED Final   Klebsiella pneumoniae NOT DETECTED NOT DETECTED Final   Proteus species NOT DETECTED NOT DETECTED Final   Serratia marcescens NOT DETECTED NOT DETECTED Final   Carbapenem resistance NOT DETECTED NOT DETECTED Final   Haemophilus influenzae NOT DETECTED NOT DETECTED Final   Neisseria meningitidis NOT DETECTED NOT DETECTED Final   Pseudomonas aeruginosa NOT DETECTED NOT DETECTED Final   Candida albicans NOT DETECTED NOT DETECTED Final   Candida glabrata NOT DETECTED NOT DETECTED Final   Candida krusei NOT DETECTED NOT DETECTED Final   Candida parapsilosis NOT DETECTED NOT DETECTED Final   Candida tropicalis NOT DETECTED NOT DETECTED Final    Comment: Performed at [facility], [address] [place], [place] [address]    Cliffton Asters, MD [facility] [phone] pager   [phone] cell 11/22/2017, 7:12 PM

## 2017-11-22 NOTE — ED Notes (Addendum)
Pt request medication for anxiety, pt was made aware that she had hydroxyzine pt replied to friend in room " that is like a benadryl, I think [facility] will [facility]".   Informed pt that I would make contact with MD to make her aware that pt wanted to leave. Spoke with Dr, Julian ReilGardner regarding pt and was referred to K. Schorr, and she was paged.

## 2017-11-22 NOTE — ED Notes (Signed)
Pt left AMA °

## 2017-11-23 LAB — URINE CULTURE

## 2017-11-23 NOTE — Discharge Summary (Addendum)
Discharge Summary  Carolyn Frey XBM:[id number] DOB: 17-Oct-1980   Pt left AMA    PCP: Patient, No Pcp Per  Admit date: 11/21/2017 Discharge date: 11/22/2017  Time spent: <65mins, I was not present at time of her leaving AMA, she left the hospital, at 9pm on 11/22/2017.    Discharge Diagnoses:  Active Hospital Problems   Diagnosis Date Noted  . Hepatitis C antibody test positive 11/22/2017  . Drug addiction (HCC) 11/22/2017  . Cigarette smoker 11/22/2017  . Normocytic anemia 11/22/2017  . Bacteremia due to methicillin susceptible Staphylococcus aureus (MSSA) 11/21/2017  . IVDU (intravenous drug user) 11/21/2017    Resolved Hospital Problems  No resolved problems to display.    Discharge Condition: stable  Diet recommendation: regular   Filed Weights   11/21/17 1440 11/21/17 1553  Weight: 54.4 kg (120 lb) 54.4 kg (120 lb)    History of present illness: (per admitting MD Dr Mariea Clonts) PCP: Patient, No Pcp Per   Patient coming from: Home  Chief Complaint: Generalized body aches, Fever.  HPI: Carolyn Frey is a 37 y.o. female with medical history significant for IV drug abuse heroin, presented to the ED today with complaints of generalized body aches, feeling like her head would explode, fevers of 4 days duration.  Reports left elbow pain, new.  No new skin rash, denies shortness of breath, no chest pain no cough, no abdominal pain vomiting or diarrhea, dysuria or frequency. she denies alcohol intake, smokes half pack cigarettes per day, occasionally takes methamphetamine also, last IV drug use was this morning heroin.  Patient went to [facility] ED at [facility] 11/17/17, blood work was drawn but patient left AMA.  Patient had a lactic acidosis and blood cultures grew MSSA.   ED Course: Pressure up to 102.1 in ED, tachycardia up to 174, bp soft-systolics 90s-100 ( 85/57, sats 85% - Spurious read per EDP).  Lactic acid is elevated at 3.49, WBC normal 8.8,  hemoglobin low but at baseline 10.3.  Potassium low 3.  UA rare bacteria.  Flu negative.  Chest x-ray left elbow x-ray negative for acute abnormality.  Patient was started on IV vancomycin, IV Zosyn and Iv cefepime in ED also given Tamiflu.  Pt was given a total of 3L IVF. Hospitalist was called to admit for sepsis likely 2/2 bacteremia and possible endocarditis.    Hospital Course:  Active Problems:   Bacteremia due to methicillin susceptible Staphylococcus aureus (MSSA)   IVDU (intravenous drug user)   Hepatitis C antibody test positive   Drug addiction (HCC)   Cigarette smoker   Normocytic anemia  MSSA bacteremia from iv drug use, sepsis presented on admission with fever 103, sinus tachycardia, heart rate 162, rr 22-24, blood pressure 85/57, lactic acidosis lactic acid 3.49,  She is started on ancef, ivf, improving Echo pending Repeat blood culture pending ID consulted  Hyponatremia/hypokalemia/hypomagnesimia:  Replaced, repeat in am  Heroin Abuse-last IV drug use  a.m on 2/6 -she is started on Norco prn -start on clonidine detox protocol -hiv negative, hepC +   Smoker: nicotine patch     Procedures:  none  Consultations:  Infectious disease   Discharge Exam: BP 120/79 (BP Location: Right Arm)   Pulse 98   Temp 97.9 F (36.6 C) (Oral)   Resp 12   Ht 5\' 4"  (1.626 m)   Wt 54.4 kg (120 lb)   SpO2 100%   BMI 20.60 kg/m   General: * Cardiovascular: * Respiratory: *  Discharge Instructions You were cared for by a hospitalist during your hospital stay. If you have any questions about your discharge medications or the care you received while you were in the hospital after you are discharged, you can call the unit and asked to speak with the hospitalist on call if the hospitalist that took care of you is not available. Once you are discharged, your primary care physician will handle any further medical issues. Please note that NO REFILLS for any discharge  medications will be authorized once you are discharged, as it is imperative that you return to your primary care physician (or establish a relationship with a primary care physician if you do not have one) for your aftercare needs so that they can reassess your need for medications and monitor your lab values.   Allergies as of 11/22/2017   No Known Allergies     Medication List    ASK your doctor about these medications   acetaminophen 500 MG tablet Commonly known as:  TYLENOL Take 1,500 mg by mouth every 4 (four) hours as needed for headache.   oseltamivir 75 MG capsule Commonly known as:  TAMIFLU Take 1 capsule (75 mg total) by mouth every 12 (twelve) hours.   traMADol 50 MG tablet Commonly known as:  ULTRAM Take 1 tablet (50 mg total) by mouth every 6 (six) hours as needed.      No Known Allergies    The results of significant diagnostics from this hospitalization (including imaging, microbiology, ancillary and laboratory) are listed below for reference.    Significant Diagnostic Studies: Dg Chest 2 View  Result Date: 11/21/2017 CLINICAL DATA:  Palpitations and fever. Last heroin use this morning. EXAM: CHEST  2 VIEW COMPARISON:  November 17, 2017 FINDINGS: The heart size and mediastinal contours are within normal limits. Both lungs are clear. The visualized skeletal structures are unremarkable. IMPRESSION: No active cardiopulmonary disease. Electronically Signed   By: Gerome Sam III M.D   On: 11/21/2017 17:12   Dg Chest 2 View  Result Date: 11/17/2017 CLINICAL DATA:  Back pain. Abdominal pain, cough with fever. Confirmed IV drug use. Sepsis EXAM: CHEST  2 VIEW COMPARISON:  None. FINDINGS: The heart size and mediastinal contours are within normal limits. Both lungs are clear. The visualized skeletal structures are unremarkable. IMPRESSION: No active cardiopulmonary disease. Electronically Signed   By: Elsie Stain M.D.   On: 11/17/2017 19:03   Dg Elbow Complete  Left  Result Date: 11/21/2017 CLINICAL DATA:  Intravenous drug use.  Sepsis.  Pain. EXAM: LEFT ELBOW - COMPLETE 3+ VIEW COMPARISON:  None. FINDINGS: Artifact related to overlying IV. Previous ORIF of an olecranon fracture appears unremarkable. Small osteochondroma the anterior distal humerus, not likely of clinical relevance. No sign of osteomyelitis, joint effusion or gas in the soft tissues. IMPRESSION: No acute finding.  See above discussion. Electronically Signed   By: Paulina Fusi M.D.   On: 11/21/2017 17:13    Microbiology: Recent Results (from the past 240 hour(s))  Rapid strep screen     Status: None   Collection Time: 11/17/17  4:46 PM  Result Value Ref Range Status   Streptococcus, Group A Screen (Direct) NEGATIVE NEGATIVE Final    Comment: (NOTE) A Rapid Antigen test may result negative if the antigen level in the sample is below the detection level of this test. The FDA has not cleared this test as a stand-alone test therefore the rapid antigen negative result has reflexed to a Group A  Strep culture. Performed at [facility], [address] [place], [place] [address]   Culture, group A strep     Status: None   Collection Time: 11/17/17  4:46 PM  Result Value Ref Range Status   Specimen Description   Final    THROAT Performed at [facility], [address] [place], [place] [address]    Special Requests   Final    NONE Reflexed from [phone] Performed at [facility], [address] [place], [place] [address]    Culture   Final    NO GROUP A STREP (S.PYOGENES) ISOLATED Performed at [facility], [address] [place], [place] [address]    Report Status 11/20/2017 FINAL  Final  Blood Culture (routine x 2)     Status: Abnormal   Collection Time: 11/17/17  5:15 PM  Result Value Ref Range Status   Specimen Description   Final    BLOOD LEFT ANTECUBITAL Performed at [facility], [address] [place], [place]  [address]    Special Requests   Final    BOTTLES DRAWN AEROBIC AND ANAEROBIC Blood Culture adequate volume Performed at [facility], [address] [place], [place] [address]    Culture  Setup Time   Final    GRAM POSITIVE COCCI IN BOTH AEROBIC AND ANAEROBIC BOTTLES CRITICAL RESULT CALLED TO, READ BACK BY AND VERIFIED WITH: K CONNOR,RN AT 1251 11/18/17 BY L BENFIELD    Culture (A)  Final    STAPHYLOCOCCUS AUREUS SUSCEPTIBILITIES PERFORMED ON PREVIOUS CULTURE WITHIN THE LAST 5 DAYS. Performed at [facility], [address] [place], [place] [address]    Report Status 11/20/2017 FINAL  Final  Blood Culture ID Panel (Reflexed)     Status: Abnormal   Collection Time: 11/17/17  5:15 PM  Result Value Ref Range Status   Enterococcus species NOT DETECTED NOT DETECTED Final   Listeria monocytogenes NOT DETECTED NOT DETECTED Final   Staphylococcus species DETECTED (A) NOT DETECTED Final    Comment: CRITICAL RESULT CALLED TO, READ BACK BY AND VERIFIED WITH: K CONNOR,RN AT 1251 11/18/17 BY L BENFIELD    Staphylococcus aureus DETECTED (A) NOT DETECTED Final    Comment: Methicillin (oxacillin) susceptible Staphylococcus aureus (MSSA). Preferred therapy is anti staphylococcal beta lactam antibiotic (Cefazolin or Nafcillin), unless clinically contraindicated. CRITICAL RESULT CALLED TO, READ BACK BY AND VERIFIED WITH: K CONNOR,RN AT 1251 11/18/17 BY L BENFIELD    Methicillin resistance NOT DETECTED NOT DETECTED Final   Streptococcus species NOT DETECTED NOT DETECTED Final   Streptococcus agalactiae NOT DETECTED NOT DETECTED Final   Streptococcus pneumoniae NOT DETECTED NOT DETECTED Final   Streptococcus pyogenes NOT DETECTED NOT DETECTED Final   Acinetobacter baumannii NOT DETECTED NOT DETECTED Final   Enterobacteriaceae species NOT DETECTED NOT DETECTED Final   Enterobacter cloacae complex NOT DETECTED NOT DETECTED Final   Escherichia coli NOT DETECTED NOT DETECTED Final    Klebsiella oxytoca NOT DETECTED NOT DETECTED Final   Klebsiella pneumoniae NOT DETECTED NOT DETECTED Final   Proteus species NOT DETECTED NOT DETECTED Final   Serratia marcescens NOT DETECTED NOT DETECTED Final   Haemophilus influenzae NOT DETECTED NOT DETECTED Final   Neisseria meningitidis NOT DETECTED NOT DETECTED Final   Pseudomonas aeruginosa NOT DETECTED NOT DETECTED Final   Candida albicans NOT DETECTED NOT DETECTED Final   Candida glabrata NOT DETECTED NOT DETECTED Final  Candida krusei NOT DETECTED NOT DETECTED Final   Candida parapsilosis NOT DETECTED NOT DETECTED Final   Candida tropicalis NOT DETECTED NOT DETECTED Final    Comment: Performed at [facility], [address] [place], [place] [address]  Respiratory Panel by PCR     Status: None   Collection Time: 11/17/17  5:41 PM  Result Value Ref Range Status   Adenovirus NOT DETECTED NOT DETECTED Final   Coronavirus 229E NOT DETECTED NOT DETECTED Final   Coronavirus HKU1 NOT DETECTED NOT DETECTED Final   Coronavirus NL63 NOT DETECTED NOT DETECTED Final   Coronavirus OC43 NOT DETECTED NOT DETECTED Final   Metapneumovirus NOT DETECTED NOT DETECTED Final   Rhinovirus / Enterovirus NOT DETECTED NOT DETECTED Final   Influenza A NOT DETECTED NOT DETECTED Final   Influenza B NOT DETECTED NOT DETECTED Final   Parainfluenza Virus 1 NOT DETECTED NOT DETECTED Final   Parainfluenza Virus 2 NOT DETECTED NOT DETECTED Final   Parainfluenza Virus 3 NOT DETECTED NOT DETECTED Final   Parainfluenza Virus 4 NOT DETECTED NOT DETECTED Final   Respiratory Syncytial Virus NOT DETECTED NOT DETECTED Final   Bordetella pertussis NOT DETECTED NOT DETECTED Final   Chlamydophila pneumoniae NOT DETECTED NOT DETECTED Final   Mycoplasma pneumoniae NOT DETECTED NOT DETECTED Final    Comment: Performed at [facility], [address] [place], [place] [address]  Blood Culture (routine x 2)     Status: Abnormal   Collection Time: 11/17/17   5:56 PM  Result Value Ref Range Status   Specimen Description   Final    BLOOD RIGHT FOREARM Performed at [facility] [place], [address] [place], [place] [address]    Special Requests   Final    BOTTLES DRAWN AEROBIC AND ANAEROBIC Blood Culture results may not be optimal due to an inadequate volume of blood received in culture bottles Performed at [facility] [place] [facility], [address] [place], [place] [address]    Culture  Setup Time   Final    GRAM POSITIVE COCCI IN BOTH AEROBIC AND ANAEROBIC BOTTLES CRITICAL RESULT CALLED TO, READ BACK BY AND VERIFIED WITH: K CONNOR,RN AT 1251 11/18/17 BY L BENFIELD Performed at [facility], [address] [place], [place] [address]    Culture STAPHYLOCOCCUS AUREUS (A)  Final   Report Status 11/20/2017 FINAL  Final   Organism ID, Bacteria STAPHYLOCOCCUS AUREUS  Final      Susceptibility   Staphylococcus aureus - MIC*    CIPROFLOXACIN <=0.5 SENSITIVE Sensitive     ERYTHROMYCIN >=8 RESISTANT Resistant     GENTAMICIN <=0.5 SENSITIVE Sensitive     OXACILLIN <=0.25 SENSITIVE Sensitive     TETRACYCLINE >=16 RESISTANT Resistant     VANCOMYCIN 1 SENSITIVE Sensitive     TRIMETH/SULFA <=10 SENSITIVE Sensitive     CLINDAMYCIN <=0.25 SENSITIVE Sensitive     RIFAMPIN <=0.5 SENSITIVE Sensitive     Inducible Clindamycin NEGATIVE Sensitive     * STAPHYLOCOCCUS AUREUS  Blood culture (routine x 2)     Status: None (Preliminary result)   Collection Time: 11/21/17  4:22 PM  Result Value Ref Range Status   Specimen Description   Final    BLOOD LEFT ANTECUBITAL Performed at [facility], [address] [place], [place] [address]    Special Requests   Final    BOTTLES DRAWN AEROBIC AND ANAEROBIC Blood Culture adequate volume Performed at [facility],  [address], [place], [place] [address]    Culture  Setup Time   Final    GRAM POSITIVE COCCI IN BOTH AEROBIC AND ANAEROBIC BOTTLES CRITICAL RESULT  CALLED TO, READ BACK BY AND VERIFIED WITH: Neta Mends [id number] MLM Performed at [facility], [address] [place], [place] [address]    Culture GRAM POSITIVE COCCI  Final   Report Status PENDING  Incomplete  Blood culture (routine x 2)     Status: None (Preliminary result)   Collection Time: 11/21/17  4:22 PM  Result Value Ref Range Status   Specimen Description   Final    BLOOD LEFT HAND Performed at [facility], [address] [place], [place] [address]    Special Requests   Final    BOTTLES DRAWN AEROBIC AND ANAEROBIC Blood Culture adequate volume Performed at [facility], [address] [place], [place] [address]    Culture  Setup Time   Final    GRAM POSITIVE COCCI IN BOTH AEROBIC AND ANAEROBIC BOTTLES Performed at [facility], [address] [place], [place] [address]    Culture GRAM POSITIVE COCCI  Final   Report Status PENDING  Incomplete  Blood Culture ID Panel (Reflexed)     Status: Abnormal   Collection Time: 11/21/17  4:22 PM  Result Value Ref Range Status   Enterococcus species NOT DETECTED NOT DETECTED Final   Vancomycin resistance NOT DETECTED NOT DETECTED Final   Listeria monocytogenes NOT DETECTED NOT DETECTED Final   Staphylococcus species DETECTED (A) NOT DETECTED Final    Comment: CRITICAL RESULT CALLED TO, READ BACK BY AND VERIFIED WITH: PHARMD E WILLIAMSON [id number] MLM    Staphylococcus aureus DETECTED (A) NOT DETECTED Final    Comment: CRITICAL RESULT CALLED TO, READ BACK BY AND VERIFIED WITH: PHARMD E WILLIAMSON [id number] MLM Methicillin (oxacillin) susceptible Staphylococcus aureus (MSSA). Preferred therapy is anti staphylococcal beta lactam antibiotic (Cefazolin or Nafcillin), unless clinically contraindicated.    Methicillin resistance NOT DETECTED NOT DETECTED Final   Streptococcus species NOT DETECTED NOT DETECTED Final   Streptococcus agalactiae NOT DETECTED NOT DETECTED  Final   Streptococcus pneumoniae NOT DETECTED NOT DETECTED Final   Streptococcus pyogenes NOT DETECTED NOT DETECTED Final   Acinetobacter baumannii NOT DETECTED NOT DETECTED Final   Enterobacteriaceae species NOT DETECTED NOT DETECTED Final   Enterobacter cloacae complex NOT DETECTED NOT DETECTED Final   Escherichia coli NOT DETECTED NOT DETECTED Final   Klebsiella oxytoca NOT DETECTED NOT DETECTED Final   Klebsiella pneumoniae NOT DETECTED NOT DETECTED Final   Proteus species NOT DETECTED NOT DETECTED Final   Serratia marcescens NOT DETECTED NOT DETECTED Final   Carbapenem resistance NOT DETECTED NOT DETECTED Final   Haemophilus influenzae NOT DETECTED NOT DETECTED Final   Neisseria meningitidis NOT DETECTED NOT DETECTED Final   Pseudomonas aeruginosa NOT DETECTED NOT DETECTED Final   Candida albicans NOT DETECTED NOT DETECTED Final   Candida glabrata NOT DETECTED NOT DETECTED Final   Candida krusei NOT DETECTED NOT DETECTED Final   Candida parapsilosis NOT DETECTED NOT DETECTED Final   Candida tropicalis NOT DETECTED NOT DETECTED Final    Comment: Performed at [facility], [address], [place], [place] [address]     Labs: Basic Metabolic Panel: Recent Labs  Lab 11/17/17 1735 11/21/17 1528 11/21/17 1622 11/21/17 1646 11/22/17 0830  NA 130* 132* 134*  --  133*  K 3.8 4.2 3.0*  --  4.0  CL 97* 95* 103  --  105  CO2 23  --  25  --  23  GLUCOSE 134* 138* 148*  --  148*  BUN 9 11 11   --  8  CREATININE 0.95 0.90 0.94  --  0.92  CALCIUM 8.7*  --  7.0*  --  7.2*  MG  --   --   --  1.4* 2.1   Liver Function Tests: Recent Labs  Lab 11/17/17 1735 11/21/17 1622  AST 116* 32  ALT 83* 29  ALKPHOS 151* 151*  BILITOT 0.5 0.2*  PROT 7.1 5.2*  ALBUMIN 3.0* 2.0*   No results for input(s): LIPASE, AMYLASE in the last 168 hours. No results for input(s): AMMONIA in the last 168 hours. CBC: Recent Labs  Lab 11/17/17 1735 11/21/17 1450 11/21/17 1528  WBC 7.9 8.8  --    NEUTROABS 7.0 7.9*  --   HGB 10.7* 10.3* 12.6  HCT 32.7* 31.5* 37.0  MCV 81.1 79.1  --   PLT 215 179  --    Cardiac Enzymes: No results for input(s): CKTOTAL, CKMB, CKMBINDEX, TROPONINI in the last 168 hours. BNP: BNP (last 3 results) No results for input(s): BNP in the last 8760 hours.  ProBNP (last 3 results) No results for input(s): PROBNP in the last 8760 hours.  CBG: No results for input(s): GLUCAP in the last 168 hours.     Signed:  Albertine Grates MD, PhD  [facility] 11/23/2017, 8:36 AM

## 2017-11-24 LAB — CULTURE, BLOOD (ROUTINE X 2): Special Requests: ADEQUATE

## 2017-11-25 ENCOUNTER — Telehealth: Payer: Self-pay

## 2017-11-25 LAB — CULTURE, BLOOD (ROUTINE X 2): SPECIAL REQUESTS: ADEQUATE

## 2017-11-25 NOTE — Progress Notes (Signed)
ED Antimicrobial Stewardship Positive Culture Follow Up   Carolyn Frey is an 37 y.o. female who presented to [facility] on 11/21/2017 with a chief complaint of  Chief Complaint  Patient presents with  . Tachycardia  . Fever    Recent Results (from the past 720 hour(s))  Rapid strep screen     Status: None   Collection Time: 11/17/17  4:46 PM  Result Value Ref Range Status   Streptococcus, Group A Screen (Direct) NEGATIVE NEGATIVE Final    Comment: (NOTE) A Rapid Antigen test may result negative if the antigen level in the sample is below the detection level of this test. The FDA has not cleared this test as a stand-alone test therefore the rapid antigen negative result has reflexed to a Group A Strep culture. Performed at [facility], [address] [place], [place] [address]   Culture, group A strep     Status: None   Collection Time: 11/17/17  4:46 PM  Result Value Ref Range Status   Specimen Description   Final    THROAT Performed at [facility], [address] [place], [place] [address]    Special Requests   Final    NONE Reflexed from [id number] Performed at [facility], [address] [place], [place] [address]    Culture   Final    NO GROUP A STREP (S.PYOGENES) ISOLATED Performed at [facility], [address], [place], [place] [address]    Report Status 11/20/2017 FINAL  Final  Blood Culture (routine x 2)     Status: Abnormal   Collection Time: 11/17/17  5:15 PM  Result Value Ref Range Status   Specimen Description   Final    BLOOD LEFT ANTECUBITAL Performed at [facility], [address] [place], [place] [address]    Special Requests   Final    BOTTLES DRAWN AEROBIC AND ANAEROBIC Blood Culture adequate volume Performed at [facility], [address] [place], [place] [address]    Culture  Setup Time   Final    GRAM POSITIVE COCCI IN BOTH AEROBIC AND ANAEROBIC BOTTLES CRITICAL RESULT  CALLED TO, READ BACK BY AND VERIFIED WITH: K CONNOR,RN AT 1251 11/18/17 BY L BENFIELD    Culture (A)  Final    STAPHYLOCOCCUS AUREUS SUSCEPTIBILITIES PERFORMED ON PREVIOUS CULTURE WITHIN THE LAST 5 DAYS. Performed at [facility], [address], [place], [place] [address]    Report Status 11/20/2017 FINAL  Final  Blood Culture ID Panel (Reflexed)     Status: Abnormal   Collection Time: 11/17/17  5:15 PM  Result Value Ref Range Status   Enterococcus species NOT DETECTED NOT DETECTED Final   Listeria monocytogenes NOT DETECTED NOT DETECTED Final   Staphylococcus species DETECTED (A) NOT DETECTED Final    Comment: CRITICAL RESULT CALLED TO, READ BACK BY AND VERIFIED WITH: K CONNOR,RN AT 1251 11/18/17 BY L BENFIELD    Staphylococcus aureus DETECTED (A) NOT DETECTED Final    Comment: Methicillin (oxacillin) susceptible Staphylococcus aureus (MSSA). Preferred therapy is anti staphylococcal beta lactam antibiotic (Cefazolin or Nafcillin), unless clinically contraindicated. CRITICAL RESULT CALLED TO, READ BACK BY AND VERIFIED WITH: K CONNOR,RN AT 1251 11/18/17 BY L BENFIELD    Methicillin resistance NOT DETECTED NOT DETECTED Final   Streptococcus species NOT DETECTED NOT DETECTED Final   Streptococcus agalactiae NOT DETECTED NOT DETECTED Final   Streptococcus  pneumoniae NOT DETECTED NOT DETECTED Final   Streptococcus pyogenes NOT DETECTED NOT DETECTED Final   Acinetobacter baumannii NOT DETECTED NOT DETECTED Final   Enterobacteriaceae species NOT DETECTED NOT DETECTED Final   Enterobacter cloacae complex NOT DETECTED NOT DETECTED Final   Escherichia coli NOT DETECTED NOT DETECTED Final   Klebsiella oxytoca NOT DETECTED NOT DETECTED Final   Klebsiella pneumoniae NOT DETECTED NOT DETECTED Final   Proteus species NOT DETECTED NOT DETECTED Final   Serratia marcescens NOT DETECTED NOT DETECTED Final   Haemophilus influenzae NOT DETECTED NOT DETECTED Final   Neisseria meningitidis NOT DETECTED  NOT DETECTED Final   Pseudomonas aeruginosa NOT DETECTED NOT DETECTED Final   Candida albicans NOT DETECTED NOT DETECTED Final   Candida glabrata NOT DETECTED NOT DETECTED Final   Candida krusei NOT DETECTED NOT DETECTED Final   Candida parapsilosis NOT DETECTED NOT DETECTED Final   Candida tropicalis NOT DETECTED NOT DETECTED Final    Comment: Performed at [facility], [address] [place], [place] [address]  Respiratory Panel by PCR     Status: None   Collection Time: 11/17/17  5:41 PM  Result Value Ref Range Status   Adenovirus NOT DETECTED NOT DETECTED Final   Coronavirus 229E NOT DETECTED NOT DETECTED Final   Coronavirus HKU1 NOT DETECTED NOT DETECTED Final   Coronavirus NL63 NOT DETECTED NOT DETECTED Final   Coronavirus OC43 NOT DETECTED NOT DETECTED Final   Metapneumovirus NOT DETECTED NOT DETECTED Final   Rhinovirus / Enterovirus NOT DETECTED NOT DETECTED Final   Influenza A NOT DETECTED NOT DETECTED Final   Influenza B NOT DETECTED NOT DETECTED Final   Parainfluenza Virus 1 NOT DETECTED NOT DETECTED Final   Parainfluenza Virus 2 NOT DETECTED NOT DETECTED Final   Parainfluenza Virus 3 NOT DETECTED NOT DETECTED Final   Parainfluenza Virus 4 NOT DETECTED NOT DETECTED Final   Respiratory Syncytial Virus NOT DETECTED NOT DETECTED Final   Bordetella pertussis NOT DETECTED NOT DETECTED Final   Chlamydophila pneumoniae NOT DETECTED NOT DETECTED Final   Mycoplasma pneumoniae NOT DETECTED NOT DETECTED Final    Comment: Performed at [facility], [address] [place], [place] [address]  Blood Culture (routine x 2)     Status: Abnormal   Collection Time: 11/17/17  5:56 PM  Result Value Ref Range Status   Specimen Description   Final    BLOOD RIGHT FOREARM Performed at [facility], [address] [place], [place] [address]    Special Requests   Final    BOTTLES DRAWN AEROBIC AND ANAEROBIC Blood Culture results may not be optimal due to an inadequate  volume of blood received in culture bottles Performed at [facility], [address] [place], [place] [address]    Culture  Setup Time   Final    GRAM POSITIVE COCCI IN BOTH AEROBIC AND ANAEROBIC BOTTLES CRITICAL RESULT CALLED TO, READ BACK BY AND VERIFIED WITH: K CONNOR,RN AT 1251 11/18/17 BY L BENFIELD Performed at [facility], [address] [place], [place] [address]    Culture STAPHYLOCOCCUS AUREUS (A)  Final   Report Status 11/20/2017 FINAL  Final   Organism ID, Bacteria STAPHYLOCOCCUS AUREUS  Final      Susceptibility   Staphylococcus aureus - MIC*    CIPROFLOXACIN <=0.5 SENSITIVE Sensitive     ERYTHROMYCIN >=8 RESISTANT Resistant     GENTAMICIN <=0.5 SENSITIVE Sensitive     OXACILLIN <=0.25 SENSITIVE  Sensitive     TETRACYCLINE >=16 RESISTANT Resistant     VANCOMYCIN 1 SENSITIVE Sensitive     TRIMETH/SULFA <=10 SENSITIVE Sensitive     CLINDAMYCIN <=0.25 SENSITIVE Sensitive     RIFAMPIN <=0.5 SENSITIVE Sensitive     Inducible Clindamycin NEGATIVE Sensitive     * STAPHYLOCOCCUS AUREUS  Urine culture     Status: Abnormal   Collection Time: 11/21/17  3:46 PM  Result Value Ref Range Status   Specimen Description   Final    URINE, RANDOM Performed at [facility], [address], [place], [place] [address]    Special Requests   Final    NONE Performed at [facility], [address], [place], [place] [address]    Culture MULTIPLE SPECIES PRESENT, SUGGEST RECOLLECTION (A)  Final   Report Status 11/23/2017 FINAL  Final  Blood culture (routine x 2)     Status: Abnormal   Collection Time: 11/21/17  4:22 PM  Result Value Ref Range Status   Specimen Description   Final    BLOOD LEFT ANTECUBITAL Performed at [facility], [address], [place], [place] [address]    Special Requests   Final    BOTTLES DRAWN AEROBIC AND ANAEROBIC Blood Culture adequate volume Performed at [facility], [address], [place], [place] [address]    Culture  Setup Time   Final    GRAM POSITIVE COCCI IN BOTH AEROBIC AND ANAEROBIC BOTTLES CRITICAL RESULT CALLED TO, READ BACK BY AND VERIFIED WITH: Neta Mends [id number] MLM Performed at [facility], [address], [place], [place] [address]    Culture STAPHYLOCOCCUS AUREUS (A)  Final   Report Status 11/24/2017 FINAL  Final   Organism ID, Bacteria STAPHYLOCOCCUS AUREUS  Final      Susceptibility   Staphylococcus aureus - MIC*    CIPROFLOXACIN <=0.5 SENSITIVE Sensitive     ERYTHROMYCIN >=8 RESISTANT Resistant     GENTAMICIN <=0.5 SENSITIVE Sensitive     OXACILLIN <=0.25 SENSITIVE Sensitive     TETRACYCLINE 8 INTERMEDIATE Intermediate     VANCOMYCIN <=0.5 SENSITIVE Sensitive     TRIMETH/SULFA <=10 SENSITIVE Sensitive     CLINDAMYCIN <=0.25 SENSITIVE Sensitive     RIFAMPIN <=0.5 SENSITIVE Sensitive     Inducible Clindamycin NEGATIVE Sensitive     * STAPHYLOCOCCUS AUREUS  Blood culture (routine x 2)     Status: Abnormal   Collection Time: 11/21/17  4:22 PM  Result Value Ref Range Status   Specimen Description   Final    BLOOD LEFT HAND Performed at [facility], [address] [place], [place] [address]    Special Requests   Final    BOTTLES DRAWN AEROBIC AND ANAEROBIC Blood Culture adequate volume Performed at [facility], [address], [place], [place] [address]    Culture  Setup Time   Final    GRAM POSITIVE COCCI IN BOTH AEROBIC AND ANAEROBIC BOTTLES CRITICAL RESULT CALLED TO, READ BACK BY AND VERIFIED WITH: PHARMD E Clinton Sawyer [id number] MLM    Culture (A)  Final    STAPHYLOCOCCUS AUREUS SUSCEPTIBILITIES PERFORMED ON PREVIOUS CULTURE WITHIN THE LAST 5 DAYS. Performed at [facility], [address], [place], [place] [address]    Report Status 11/25/2017 FINAL  Final  Blood Culture ID Panel (Reflexed)     Status: Abnormal   Collection Time:  11/21/17  4:22 PM  Result  Value Ref Range Status   Enterococcus species NOT DETECTED NOT DETECTED Final   Vancomycin resistance NOT DETECTED NOT DETECTED Final   Listeria monocytogenes NOT DETECTED NOT DETECTED Final   Staphylococcus species DETECTED (A) NOT DETECTED Final    Comment: CRITICAL RESULT CALLED TO, READ BACK BY AND VERIFIED WITH: PHARMD E WILLIAMSON [id number] MLM    Staphylococcus aureus DETECTED (A) NOT DETECTED Final    Comment: CRITICAL RESULT CALLED TO, READ BACK BY AND VERIFIED WITH: PHARMD E WILLIAMSON [phone] MLM Methicillin (oxacillin) susceptible Staphylococcus aureus (MSSA). Preferred therapy is anti staphylococcal beta lactam antibiotic (Cefazolin or Nafcillin), unless clinically contraindicated.    Methicillin resistance NOT DETECTED NOT DETECTED Final   Streptococcus species NOT DETECTED NOT DETECTED Final   Streptococcus agalactiae NOT DETECTED NOT DETECTED Final   Streptococcus pneumoniae NOT DETECTED NOT DETECTED Final   Streptococcus pyogenes NOT DETECTED NOT DETECTED Final   Acinetobacter baumannii NOT DETECTED NOT DETECTED Final   Enterobacteriaceae species NOT DETECTED NOT DETECTED Final   Enterobacter cloacae complex NOT DETECTED NOT DETECTED Final   Escherichia coli NOT DETECTED NOT DETECTED Final   Klebsiella oxytoca NOT DETECTED NOT DETECTED Final   Klebsiella pneumoniae NOT DETECTED NOT DETECTED Final   Proteus species NOT DETECTED NOT DETECTED Final   Serratia marcescens NOT DETECTED NOT DETECTED Final   Carbapenem resistance NOT DETECTED NOT DETECTED Final   Haemophilus influenzae NOT DETECTED NOT DETECTED Final   Neisseria meningitidis NOT DETECTED NOT DETECTED Final   Pseudomonas aeruginosa NOT DETECTED NOT DETECTED Final   Candida albicans NOT DETECTED NOT DETECTED Final   Candida glabrata NOT DETECTED NOT DETECTED Final   Candida krusei NOT DETECTED NOT DETECTED Final   Candida parapsilosis NOT DETECTED NOT DETECTED Final   Candida  tropicalis NOT DETECTED NOT DETECTED Final    Comment: Performed at [facility], [address] [place], [place] [address]    [x]  Patient left the hospital twice AMA without antimicrobial agent and treatment still indicated  Antibiotic prescription: Cefazolin 2G IV q8 hours  ED Provider: Etheleen Nicks Sayra Frisby 11/25/2017, 5:16 PM

## 2017-11-25 NOTE — Telephone Encounter (Addendum)
Pt with + blood cultures from ED [facility] (pt left AMA)  that need to return to Ed for IV abx per Harolyn RutherfordShawn Joy PAC. No working number. Will send letter to home address on record.

## 2017-11-26 NOTE — Telephone Encounter (Signed)
Post ED Visit - Positive Culture Follow-up: Successful Patient Follow-Up  Culture assessed and recommendations reviewed by: []  Enzo BiNathan Batchelder, Pharm.D. []  Celedonio MiyamotoJeremy Frens, Pharm.D., BCPS AQ-ID []  Garvin FilaMike Maccia, Pharm.D., BCPS [x]  Georgina PillionElizabeth Martin, Pharm.D., BCPS []  TavistockMinh Pham, [address] BCPS, AAHIVP []  Estella HuskMichelle Turner, Pharm.D., BCPS, AAHIVP []  Lysle Pearlachel Rumbarger, PharmD, BCPS []  Casilda Carlsaylor Stone, PharmD, BCPS []  Pollyann SamplesAndy Johnston, PharmD, BCPS  Positive blood culture  []  Patient discharged without antimicrobial prescription and treatment is now indicated []  Organism is resistant to prescribed ED discharge antimicrobial [x]  Patient with positive blood cultures  Changes discussed with ED provider: Alveria ApleySophia Caccavale PA  Still attempting to contact patient, needs to return for reassess/treatment with IV antibiotics, again a non working number, letter was sent by my colleague on yesterday   Berle MullMiller, Levy Wellman 11/26/2017, 1:14 PM

## 2018-01-14 DEATH — deceased

## 2018-07-30 IMAGING — CT CT TIBIA FIBULA *L* W/ CM
3 of 4 series · 9 of 33 positions shown, 10 images · IV contrast (agent unspecified)
Comparison: None.

CONTRAST:  100mL [id number] IOPAMIDOL ([id number]) INJECTION 61%

CLINICAL DATA: Left lower leg pain and swelling. History of
substance abuse.

EXAM:
CT OF THE LOWER RIGHT EXTREMITY WITH CONTRAST
TECHNIQUE: Multidetector CT imaging of the lower right extremity was performed
according to the standard protocol following intravenous contrast
administration.

[Series 5: lfov ext 3.0 b40s · axial · 0.64mm/px · z∈[+406,+406]mm · 1 of 136 slices shown, 2 images]
[im 73/136  soft-tissue]
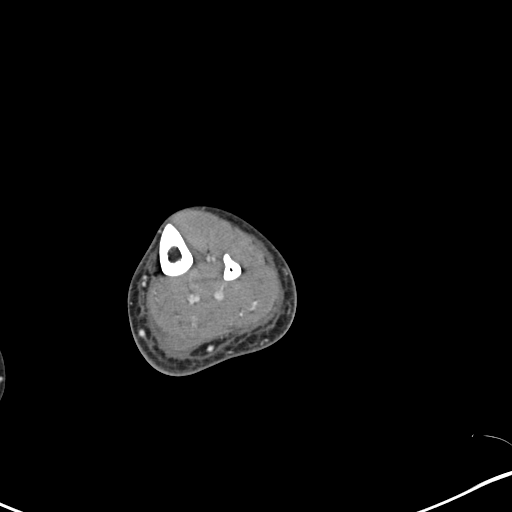
[im 73/136  bone]
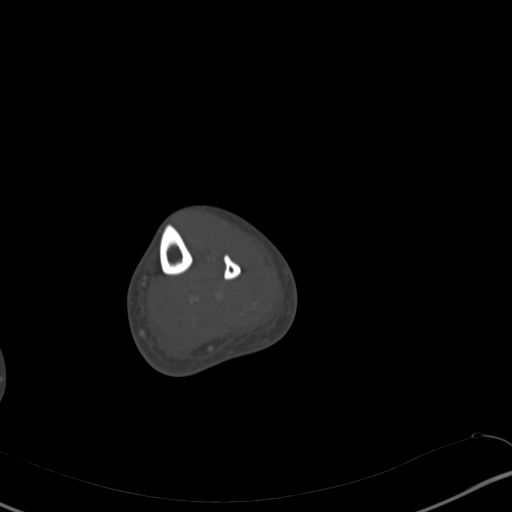

[Series 9: sagittal bone · sagittal · 0.36mm/px · 5 of 78 slices shown]
[im 13/78  bone]
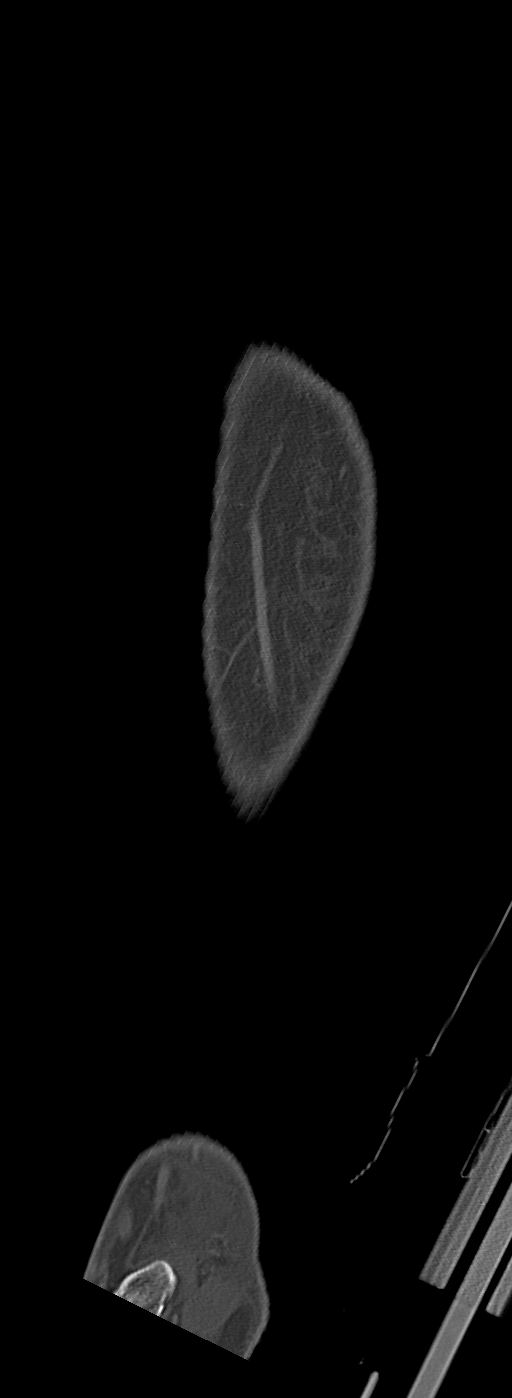
[im 26/78  bone]
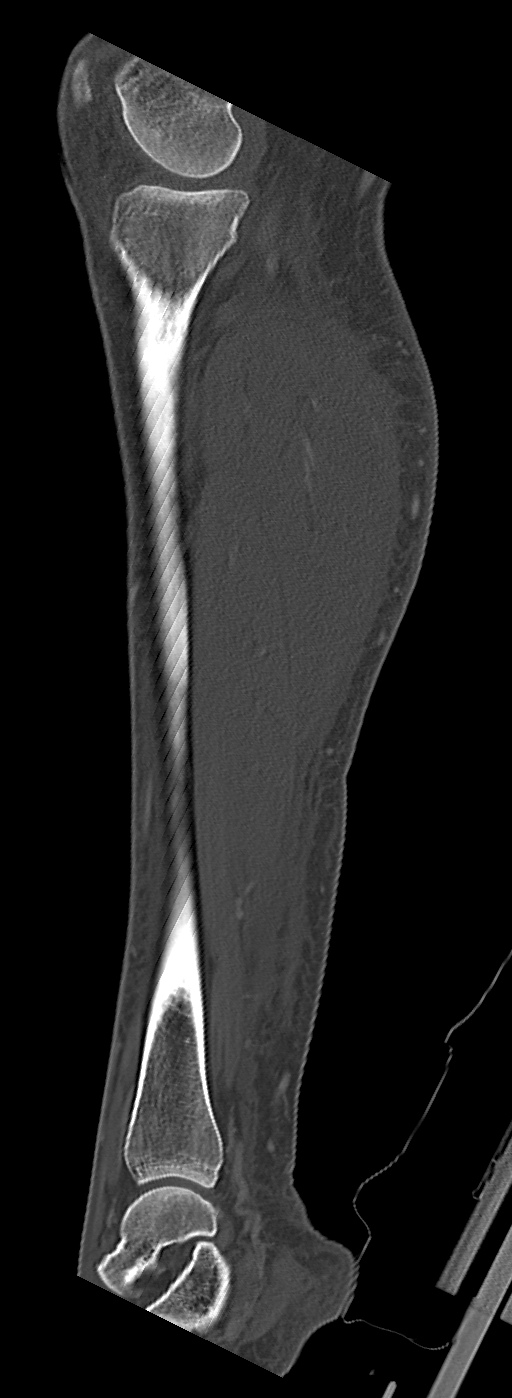
[im 39/78  bone]
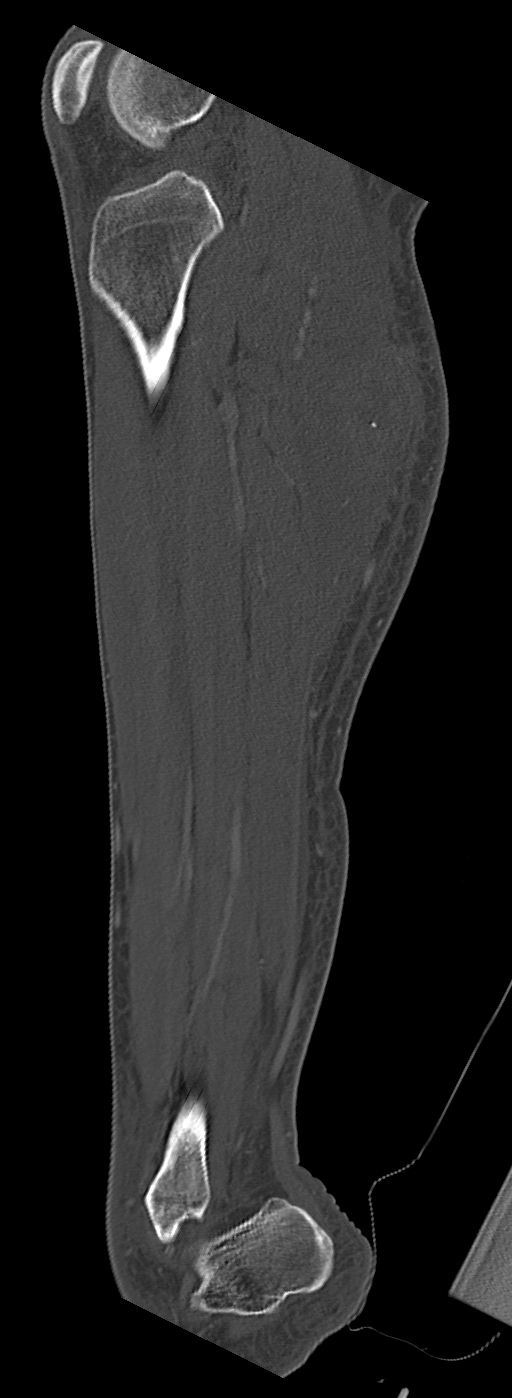
[im 52/78  bone]
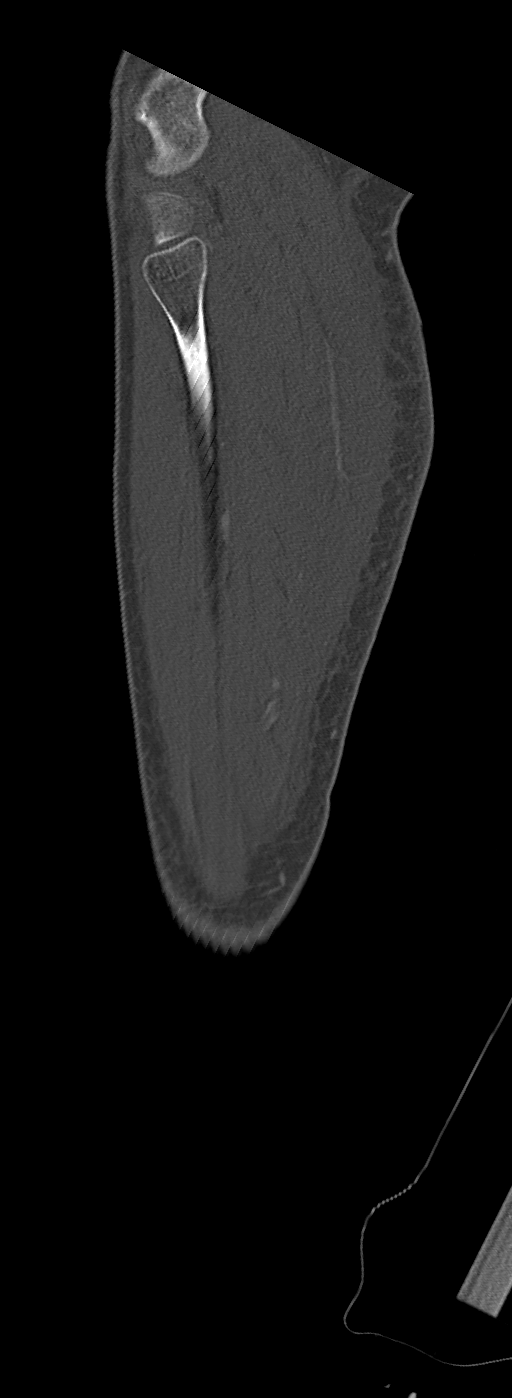
[im 65/78  bone]
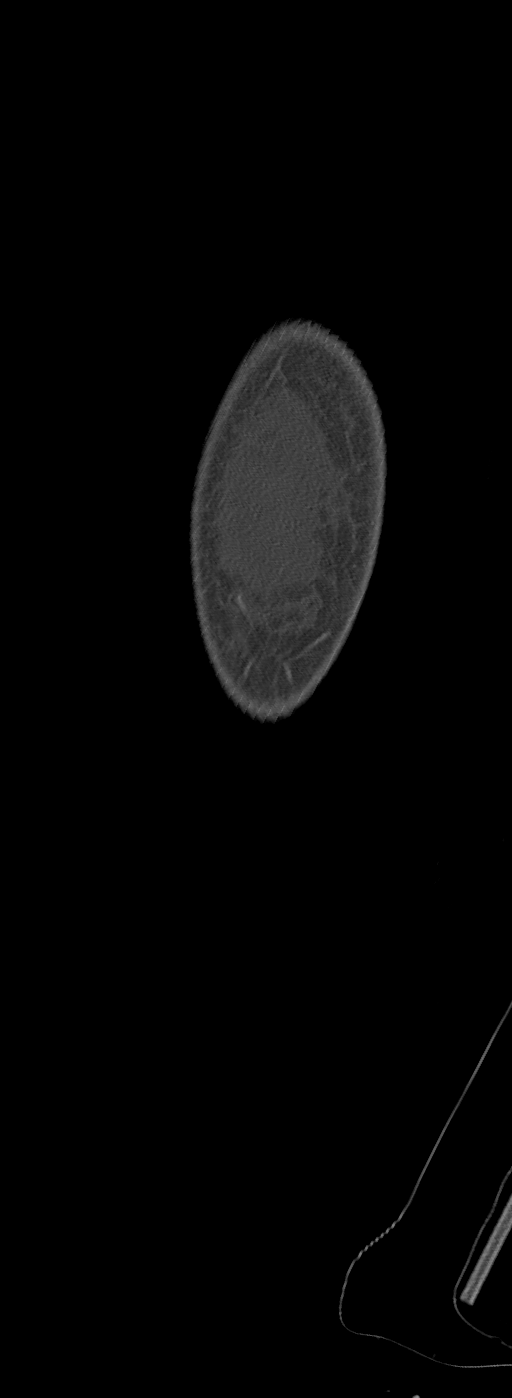

[Series 10: coronalsoft tissue · coronal · 0.58mm/px · 3 of 79 slices shown]
[im 22/79  bone]
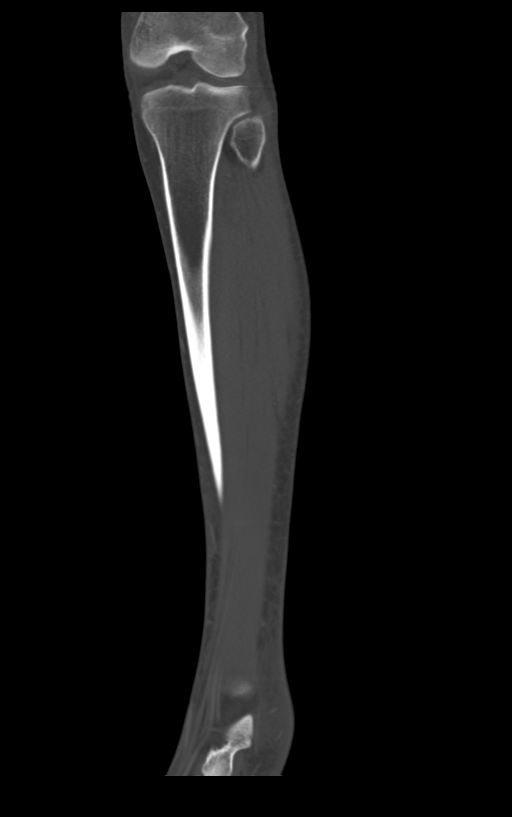
[im 34/79  bone]
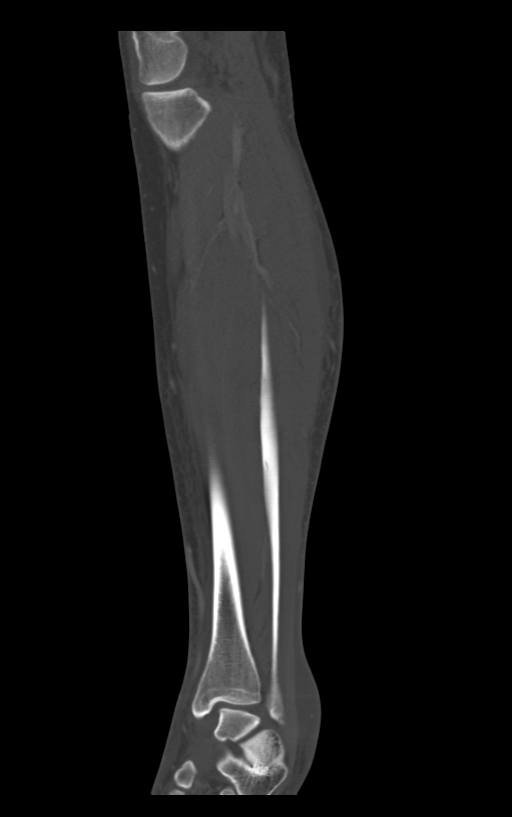
[im 46/79  bone]
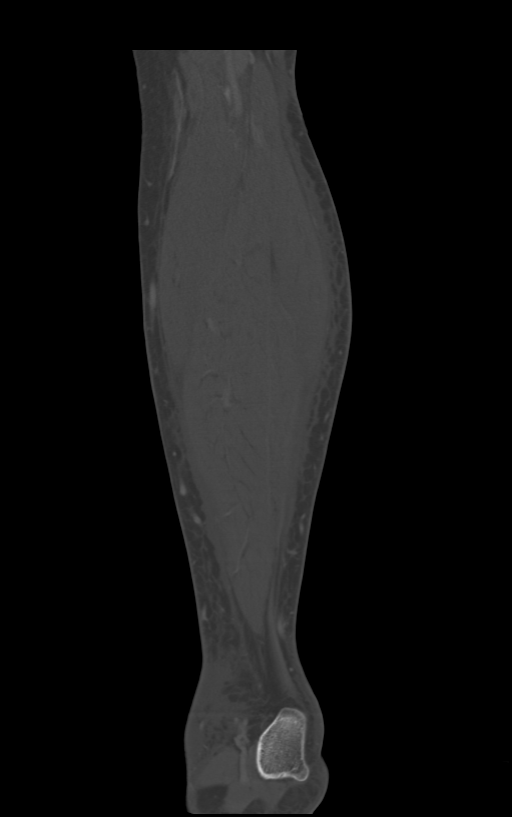

[9 of 33 positions shown; findings below may reference images not displayed]

FINDINGS: Bones/Joint/Cartilage

No fracture, periosteal reaction or bony destructive change. Knee
and ankle alignment is maintained.

Ligaments

Suboptimally assessed by CT.

Muscles and Tendons

Peripherally enhancing irregular 3.8 x 2.9 x 2.5 cm fluid collection
within proximal aspect of medial gastrocnemius muscle consistent
with abscess. Central 7 mm linear foreign body may be a needle tip.
There is associated muscle edema. Fascial fluid tracks distally of
the lower leg. No intramuscular air. Normal fatty striations
persist.

Soft tissues

Edema about the posteromedial soft tissues, most prominent adjacent
intramuscular fluid collection. Diffuse soft tissue edema tracks
distally.
IMPRESSION: Intramuscular 3.8 cm fluid collection within proximal medial
gastrocnemius muscle consistent with abscess. There is 7 mm linear
foreign body within the central collection, possible needle tip.
